# Patient Record
Sex: Female | Born: 1975 | Race: Black or African American | Hispanic: No | Marital: Married | Smoking: Never smoker
Health system: Southern US, Community
[De-identification: ages and names within clinical notes are randomized; demographics above are authoritative.]

## PROBLEM LIST (undated history)

## (undated) DIAGNOSIS — G8929 Other chronic pain: Secondary | ICD-10-CM

## (undated) DIAGNOSIS — F419 Anxiety disorder, unspecified: Secondary | ICD-10-CM

## (undated) DIAGNOSIS — F32A Depression, unspecified: Secondary | ICD-10-CM

## (undated) DIAGNOSIS — D649 Anemia, unspecified: Secondary | ICD-10-CM

## (undated) DIAGNOSIS — F329 Major depressive disorder, single episode, unspecified: Secondary | ICD-10-CM

## (undated) DIAGNOSIS — F319 Bipolar disorder, unspecified: Secondary | ICD-10-CM

## (undated) DIAGNOSIS — R109 Unspecified abdominal pain: Secondary | ICD-10-CM

## (undated) HISTORY — PX: ABDOMINAL HYSTERECTOMY: SHX81

## (undated) HISTORY — PX: KNEE SURGERY: SHX244

---

## 1998-02-20 HISTORY — PX: TUBAL LIGATION: SHX77

## 1999-11-15 ENCOUNTER — Emergency Department (HOSPITAL_COMMUNITY): Admission: EM | Admit: 1999-11-15 | Discharge: 1999-11-15 | Payer: Self-pay | Admitting: Emergency Medicine

## 2000-02-21 HISTORY — PX: MASS EXCISION: SHX2000

## 2000-06-05 ENCOUNTER — Encounter (INDEPENDENT_AMBULATORY_CARE_PROVIDER_SITE_OTHER): Payer: Self-pay

## 2000-06-05 ENCOUNTER — Ambulatory Visit (HOSPITAL_COMMUNITY): Admission: RE | Admit: 2000-06-05 | Discharge: 2000-06-05 | Payer: Self-pay | Admitting: *Deleted

## 2001-05-26 ENCOUNTER — Emergency Department (HOSPITAL_COMMUNITY): Admission: EM | Admit: 2001-05-26 | Discharge: 2001-05-27 | Payer: Self-pay | Admitting: *Deleted

## 2001-05-27 ENCOUNTER — Encounter: Payer: Self-pay | Admitting: *Deleted

## 2002-09-18 ENCOUNTER — Emergency Department (HOSPITAL_COMMUNITY): Admission: EM | Admit: 2002-09-18 | Discharge: 2002-09-18 | Payer: Self-pay | Admitting: Emergency Medicine

## 2003-03-30 ENCOUNTER — Ambulatory Visit (HOSPITAL_COMMUNITY): Admission: RE | Admit: 2003-03-30 | Discharge: 2003-03-30 | Payer: Self-pay | Admitting: Internal Medicine

## 2003-04-20 ENCOUNTER — Observation Stay (HOSPITAL_COMMUNITY): Admission: EM | Admit: 2003-04-20 | Discharge: 2003-04-21 | Payer: Self-pay | Admitting: Emergency Medicine

## 2003-04-23 ENCOUNTER — Other Ambulatory Visit (HOSPITAL_COMMUNITY): Admission: RE | Admit: 2003-04-23 | Discharge: 2003-05-01 | Payer: Self-pay | Admitting: Psychiatry

## 2003-08-20 ENCOUNTER — Other Ambulatory Visit (HOSPITAL_COMMUNITY): Admission: RE | Admit: 2003-08-20 | Discharge: 2003-09-02 | Payer: Self-pay | Admitting: Psychiatry

## 2003-10-17 ENCOUNTER — Emergency Department (HOSPITAL_COMMUNITY): Admission: EM | Admit: 2003-10-17 | Discharge: 2003-10-17 | Payer: Self-pay | Admitting: Internal Medicine

## 2004-10-30 ENCOUNTER — Emergency Department (HOSPITAL_COMMUNITY): Admission: EM | Admit: 2004-10-30 | Discharge: 2004-10-30 | Payer: Self-pay | Admitting: Emergency Medicine

## 2005-12-13 ENCOUNTER — Other Ambulatory Visit (HOSPITAL_COMMUNITY): Admission: RE | Admit: 2005-12-13 | Discharge: 2006-03-13 | Payer: Self-pay | Admitting: Psychiatry

## 2005-12-13 ENCOUNTER — Ambulatory Visit: Payer: Self-pay | Admitting: Psychiatry

## 2008-08-06 ENCOUNTER — Emergency Department (HOSPITAL_COMMUNITY): Admission: EM | Admit: 2008-08-06 | Discharge: 2008-08-06 | Payer: Self-pay | Admitting: Emergency Medicine

## 2010-02-20 HISTORY — PX: NOVASURE ABLATION: SHX5394

## 2010-07-08 NOTE — Op Note (Signed)
[facility]  Patient:    Laurie Thornton, Laurie Thornton                       MRN: [id number] Proc. Date: 06/05/00 Attending:  Vikki Ports.                           Operative Report  PREOPERATIVE DIAGNOSIS:  Right anterior abdominal wall mass.  POSTOPERATIVE DIAGNOSIS:  Right anterior abdominal wall mass.  PROCEDURE:  Excision of abdominal wall mass.  SURGEON:  Danna Hefty, M.D.  ANESTHESIA:  Local MAC.  DESCRIPTION OF PROCEDURE:  The patient was taken to the operating room, placed in supine position, after adequate anesthesia was induced using MAC technique, the right lower quadrant was prepped and draped in normal sterile fashion. In an area just anterior to the inferior superior iliac spine on the right, the skin and subcutaneous tissue was anesthetized. I dissected down onto a lobulated fatty nodular mass which was excised in its entirety. On further palpation, there were no other masses noted. This was sent for pathologic evaluation. Adequate hemostasis was ensured and the skin was closed with subcuticular 4-0 monocryl, Steri-Strips and sterile dressing was applied. The patient tolerated the procedure well and went to PACU in good condition. DD:  06/05/00 TD:  06/06/00 Job: [id number]

## 2013-06-26 ENCOUNTER — Emergency Department (HOSPITAL_COMMUNITY)
Admission: EM | Admit: 2013-06-26 | Discharge: 2013-06-26 | Disposition: A | Discharge status: Home / Self Care | Payer: Medicaid Other | Attending: Emergency Medicine | Admitting: Emergency Medicine

## 2013-06-26 ENCOUNTER — Encounter (HOSPITAL_COMMUNITY): Payer: Self-pay | Admitting: Emergency Medicine

## 2013-06-26 ENCOUNTER — Emergency Department (HOSPITAL_COMMUNITY): Payer: Medicaid Other

## 2013-06-26 DIAGNOSIS — R52 Pain, unspecified: Secondary | ICD-10-CM | POA: Insufficient documentation

## 2013-06-26 DIAGNOSIS — G8929 Other chronic pain: Secondary | ICD-10-CM | POA: Insufficient documentation

## 2013-06-26 DIAGNOSIS — R109 Unspecified abdominal pain: Secondary | ICD-10-CM

## 2013-06-26 DIAGNOSIS — Z880 Allergy status to penicillin: Secondary | ICD-10-CM | POA: Insufficient documentation

## 2013-06-26 DIAGNOSIS — B9689 Other specified bacterial agents as the cause of diseases classified elsewhere: Secondary | ICD-10-CM | POA: Insufficient documentation

## 2013-06-26 DIAGNOSIS — Z87440 Personal history of urinary (tract) infections: Secondary | ICD-10-CM | POA: Insufficient documentation

## 2013-06-26 DIAGNOSIS — N760 Acute vaginitis: Secondary | ICD-10-CM | POA: Insufficient documentation

## 2013-06-26 DIAGNOSIS — Z3202 Encounter for pregnancy test, result negative: Secondary | ICD-10-CM | POA: Insufficient documentation

## 2013-06-26 DIAGNOSIS — A499 Bacterial infection, unspecified: Secondary | ICD-10-CM | POA: Insufficient documentation

## 2013-06-26 HISTORY — DX: Unspecified abdominal pain: R10.9

## 2013-06-26 HISTORY — DX: Other chronic pain: G89.29

## 2013-06-26 LAB — CBC WITH DIFFERENTIAL/PLATELET
Basophils Absolute: 0 10*3/uL (ref 0.0–0.1)
Basophils Relative: 0 % (ref 0–1)
EOS ABS: 0.1 10*3/uL (ref 0.0–0.7)
Eosinophils Relative: 2 % (ref 0–5)
HCT: 37.4 % (ref 36.0–46.0)
Hemoglobin: 12.4 g/dL (ref 12.0–15.0)
LYMPHS ABS: 2.2 10*3/uL (ref 0.7–4.0)
Lymphocytes Relative: 47 % — ABNORMAL HIGH (ref 12–46)
MCH: 31.1 pg (ref 26.0–34.0)
MCHC: 33.2 g/dL (ref 30.0–36.0)
MCV: 93.7 fL (ref 78.0–100.0)
Monocytes Absolute: 0.4 10*3/uL (ref 0.1–1.0)
Monocytes Relative: 9 % (ref 3–12)
NEUTROS PCT: 42 % — AB (ref 43–77)
Neutro Abs: 2 10*3/uL (ref 1.7–7.7)
PLATELETS: 254 10*3/uL (ref 150–400)
RBC: 3.99 MIL/uL (ref 3.87–5.11)
RDW: 12.4 % (ref 11.5–15.5)
WBC: 4.8 10*3/uL (ref 4.0–10.5)

## 2013-06-26 LAB — COMPREHENSIVE METABOLIC PANEL
ALK PHOS: 45 U/L (ref 39–117)
ALT: 10 U/L (ref 0–35)
AST: 21 U/L (ref 0–37)
Albumin: 3.4 g/dL — ABNORMAL LOW (ref 3.5–5.2)
BUN: 12 mg/dL (ref 6–23)
CO2: 25 meq/L (ref 19–32)
Calcium: 9.6 mg/dL (ref 8.4–10.5)
Chloride: 104 mEq/L (ref 96–112)
Creatinine, Ser: 0.71 mg/dL (ref 0.50–1.10)
GFR calc non Af Amer: >90 mL/min (ref >90)
Glucose, Bld: 111 mg/dL — ABNORMAL HIGH (ref 70–99)
Potassium: 4.5 mEq/L (ref 3.7–5.3)
SODIUM: 139 meq/L (ref 137–147)
TOTAL PROTEIN: 6.9 g/dL (ref 6.0–8.3)
Total Bilirubin: 0.3 mg/dL (ref 0.3–1.2)

## 2013-06-26 LAB — URINALYSIS, ROUTINE W REFLEX MICROSCOPIC
Bilirubin Urine: NEGATIVE
Glucose, UA: NEGATIVE mg/dL
Ketones, ur: NEGATIVE mg/dL
Nitrite: NEGATIVE
Protein, ur: NEGATIVE mg/dL
Specific Gravity, Urine: 1.021 (ref 1.005–1.030)
Urobilinogen, UA: 0.2 mg/dL (ref 0.0–1.0)
pH: 7 (ref 5.0–8.0)

## 2013-06-26 LAB — WET PREP, GENITAL
TRICH WET PREP: NONE SEEN
YEAST WET PREP: NONE SEEN

## 2013-06-26 LAB — URINE MICROSCOPIC-ADD ON

## 2013-06-26 LAB — PREGNANCY, URINE: Preg Test, Ur: NEGATIVE

## 2013-06-26 LAB — LIPASE, BLOOD: Lipase: 26 U/L (ref 11–59)

## 2013-06-26 MED ORDER — MORPHINE SULFATE 4 MG/ML IJ SOLN
4.0000 mg | INTRAMUSCULAR | Status: AC | PRN
  Administered 2013-06-26 (×2): 4 mg via INTRAVENOUS
  Filled 2013-06-26 (×2): qty 1

## 2013-06-26 MED ORDER — NAPROXEN 250 MG PO TABS: 250.0000 mg | ORAL_TABLET | Freq: Two times a day (BID) | ORAL | Status: DC

## 2013-06-26 MED ORDER — METRONIDAZOLE 500 MG PO TABS: 500.0000 mg | ORAL_TABLET | Freq: Two times a day (BID) | ORAL | Status: DC

## 2013-06-26 MED ORDER — ONDANSETRON HCL 4 MG/2ML IJ SOLN
4.0000 mg | INTRAMUSCULAR | Status: AC | PRN
  Administered 2013-06-26 (×2): 4 mg via INTRAVENOUS
  Filled 2013-06-26 (×2): qty 2

## 2013-06-26 MED ORDER — IOHEXOL 300 MG/ML  SOLN
80.0000 mL | Freq: Once | INTRAMUSCULAR | Status: AC | PRN
  Administered 2013-06-26: 80 mL via INTRAVENOUS

## 2013-06-26 MED ORDER — SODIUM CHLORIDE 0.9 % IV SOLN
INTRAVENOUS | Status: DC
  Administered 2013-06-26: 10:00:00 via INTRAVENOUS

## 2013-06-26 MED ORDER — HYDROCODONE-ACETAMINOPHEN 5-325 MG PO TABS: ORAL_TABLET | ORAL | Status: DC

## 2013-06-26 MED ORDER — IOHEXOL 300 MG/ML  SOLN
25.0000 mL | Freq: Once | INTRAMUSCULAR | Status: AC | PRN
  Administered 2013-06-26: 25 mL via ORAL

## 2013-06-26 NOTE — ED Provider Notes (Signed)
CSN: [id number]     Arrival date & time 06/26/13  0844 History   First MD Initiated Contact with Patient 06/26/13 [phone]     Chief Complaint  Patient presents with  . Abdominal Pain     HPI Pt was seen at 0900.  Per pt, c/o gradual onset and persistence of constant acute flair of her chronic right sided abd "pain" for the past 1+ year. Pt states the pain was intermittent, but became constant for the past 6 months. Pt states the pain "worsened" 2 days ago. Pt describes the pain as "sharp," and radiates into her right lower back area. Has been associated with urinary frequency.  Pt has been evaluated by her PMD multiple times for same, dx with hematuria and UTI, tx with several courses of abx.  Denies N/V/D, no vaginal bleeding/discharge, no fevers, no back pain, no rash, no CP/SOB, no black or blood in stools.      PMD: [facility] FP Past Medical History  Diagnosis Date  . Chronic abdominal pain     Past Surgical History  Procedure Laterality Date  . Novasure ablation    . Tubal ligation    . Cesarean section    . Knee surgery Right x 2  . Mass excision  2002    right sided abd wall mass excision   No family history on file. History  Substance Use Topics  . Smoking status: Never Smoker   . Smokeless tobacco: Not on file  . Alcohol Use: No    Review of Systems ROS: Statement: All systems negative except as marked or noted in the HPI; Constitutional: Negative for fever and chills. ; ; Eyes: Negative for eye pain, redness and discharge. ; ; ENMT: Negative for ear pain, hoarseness, nasal congestion, sinus pressure and sore throat. ; ; Cardiovascular: Negative for chest pain, palpitations, diaphoresis, dyspnea and peripheral edema. ; ; Respiratory: Negative for cough, wheezing and stridor. ; ; Gastrointestinal: +abd pain. Negative for nausea, vomiting, diarrhea, blood in stool, hematemesis, jaundice and rectal bleeding. . ; ; Genitourinary: +urinary frequency. Negative for dysuria. ; ;  Musculoskeletal: +right sided low back pain. Negative for neck pain. Negative for swelling and trauma.; ; GYN:  No vaginal bleeding, no vaginal discharge, no vulvar pain.;;  Skin: Negative for pruritus, rash, abrasions, blisters, bruising and skin lesion.; ; Neuro: Negative for headache, lightheadedness and neck stiffness. Negative for weakness, altered level of consciousness , altered mental status, extremity weakness, paresthesias, involuntary movement, seizure and syncope.      Allergies  Amoxicillin  Home Medications   Prior to Admission medications   Not on File   BP 130/82  Pulse 65  Temp(Src) 97.4 F (36.3 C) (Oral)  Resp 18  Ht 5\' 6"  (1.676 m)  Wt 180 lb (81.647 kg)  BMI 29.07 kg/m2  SpO2 100% Physical Exam 0905: Physical examination:  Nursing notes reviewed; Vital signs and O2 SAT reviewed;  Constitutional: Well developed, Well nourished, Well hydrated, In no acute distress; Head:  Normocephalic, atraumatic; Eyes: EOMI, PERRL, No scleral icterus; ENMT: Mouth and pharynx normal, Mucous membranes moist; Neck: Supple, Full range of motion, No lymphadenopathy; Cardiovascular: Regular rate and rhythm, No murmur, rub, or gallop; Respiratory: Breath sounds clear & equal bilaterally, No rales, rhonchi, wheezes.  Speaking full sentences with ease, Normal respiratory effort/excursion; Chest: Nontender, Movement normal; Abdomen: Soft, +RUQ, RLQ tenderness to palp. Nondistended, Normal bowel sounds; Genitourinary: No CVA tenderness. Pelvic exam performed with permission of pt and female ED tech assist  during exam.  External genitalia w/o lesions. Vaginal vault without discharge.  Cervix w/o lesions, not friable, GC/chlam and wet prep obtained and sent to lab.  Bimanual exam w/o CMT, +uterine and bilateral adnexal tenderness.;; Spine:  No midline CS, TS, LS tenderness. +TTP right lumbar paraspinal muscles. No rash.;; Extremities: Pulses normal, No tenderness, No edema, No calf edema or asymmetry.;  Neuro: AA&Ox3, Major CN grossly intact.  Speech clear. No gross focal motor or sensory deficits in extremities. Climbs on and off stretcher easily by herself. Gait steady.; Skin: Color normal, Warm, Dry.   ED Course  Procedures     EKG Interpretation None      MDM  MDM Reviewed: previous chart, nursing note and vitals Reviewed previous: labs and ultrasound Interpretation: labs, x-ray, CT scan and ultrasound    Results for orders placed during the hospital encounter of 06/26/13  WET PREP, GENITAL      Result Value Ref Range   Yeast Wet Prep HPF POC NONE SEEN  NONE SEEN   Trich, Wet Prep NONE SEEN  NONE SEEN   Clue Cells Wet Prep HPF POC FEW (*) NONE SEEN   WBC, Wet Prep HPF POC FEW (*) NONE SEEN  URINALYSIS, ROUTINE W REFLEX MICROSCOPIC      Result Value Ref Range   Color, Urine YELLOW  YELLOW   APPearance CLEAR  CLEAR   Specific Gravity, Urine 1.021  1.005 - 1.030   pH 7.0  5.0 - 8.0   Glucose, UA NEGATIVE  NEGATIVE mg/dL   Hgb urine dipstick MODERATE (*) NEGATIVE   Bilirubin Urine NEGATIVE  NEGATIVE   Ketones, ur NEGATIVE  NEGATIVE mg/dL   Protein, ur NEGATIVE  NEGATIVE mg/dL   Urobilinogen, UA 0.2  0.0 - 1.0 mg/dL   Nitrite NEGATIVE  NEGATIVE   Leukocytes, UA TRACE (*) NEGATIVE  PREGNANCY, URINE      Result Value Ref Range   Preg Test, Ur NEGATIVE  NEGATIVE  CBC WITH DIFFERENTIAL      Result Value Ref Range   WBC 4.8  4.0 - 10.5 K/uL   RBC 3.99  3.87 - 5.11 MIL/uL   Hemoglobin 12.4  12.0 - 15.0 g/dL   HCT 37.4  36.0 - 46.0 %   MCV 93.7  78.0 - 100.0 fL   MCH 31.1  26.0 - 34.0 pg   MCHC 33.2  30.0 - 36.0 g/dL   RDW 12.4  11.5 - 15.5 %   Platelets 254  150 - 400 K/uL   Neutrophils Relative % 42 (*) 43 - 77 %   Neutro Abs 2.0  1.7 - 7.7 K/uL   Lymphocytes Relative 47 (*) 12 - 46 %   Lymphs Abs 2.2  0.7 - 4.0 K/uL   Monocytes Relative 9  3 - 12 %   Monocytes Absolute 0.4  0.1 - 1.0 K/uL   Eosinophils Relative 2  0 - 5 %   Eosinophils Absolute 0.1  0.0 - 0.7  K/uL   Basophils Relative 0  0 - 1 %   Basophils Absolute 0.0  0.0 - 0.1 K/uL  COMPREHENSIVE METABOLIC PANEL      Result Value Ref Range   Sodium 139  137 - 147 mEq/L   Potassium 4.5  3.7 - 5.3 mEq/L   Chloride 104  96 - 112 mEq/L   CO2 25  19 - 32 mEq/L   Glucose, Bld 111 (*) 70 - 99 mg/dL   BUN 12  6 - 23 mg/dL  Creatinine, Ser 0.71  0.50 - 1.10 mg/dL   Calcium 9.6  8.4 - 10.5 mg/dL   Total Protein 6.9  6.0 - 8.3 g/dL   Albumin 3.4 (*) 3.5 - 5.2 g/dL   AST 21  0 - 37 U/L   ALT 10  0 - 35 U/L   Alkaline Phosphatase 45  39 - 117 U/L   Total Bilirubin 0.3  0.3 - 1.2 mg/dL   GFR calc non Af Amer >90  >90 mL/min   GFR calc Af Amer >90  >90 mL/min  LIPASE, BLOOD      Result Value Ref Range   Lipase 26  11 - 59 U/L  URINE MICROSCOPIC-ADD ON      Result Value Ref Range   Squamous Epithelial / LPF FEW (*) RARE   WBC, UA 3-6  <3 WBC/hpf   RBC / HPF 7-10  <3 RBC/hpf   Bacteria, UA FEW (*) RARE   Urine-Other MUCOUS PRESENT     Dg Chest 2 View 06/26/2013   CLINICAL DATA:  Right upper quadrant pain radiating to the back.  EXAM: CHEST  2 VIEW  COMPARISON:  None.  FINDINGS: Heart size and mediastinal contours are within normal limits. Both lungs are clear. Visualized skeletal structures are unremarkable.  IMPRESSION: Negative exam.   Electronically Signed   By: Inge Rise M.D.   On: 06/26/2013 10:29   US Transvaginal Non-ob 06/26/2013   CLINICAL DATA:  Right-sided pelvic pain.  Question ovarian torsion.  EXAM: TRANSABDOMINAL AND TRANSVAGINAL ULTRASOUND OF PELVIS  DOPPLER ULTRASOUND OF OVARIES  TECHNIQUE: Both transabdominal and transvaginal ultrasound examinations of the pelvis were performed. Transabdominal technique was performed for global imaging of the pelvis including uterus, ovaries, adnexal regions, and pelvic cul-de-sac.  It was necessary to proceed with endovaginal exam following the transabdominal exam to visualize the ovaries. Color and duplex Doppler ultrasound was utilized to  evaluate blood flow to the ovaries.  COMPARISON:  CT abdomen and pelvis earlier this same day. Pelvic ultrasound 03/30/2003.  FINDINGS: Uterus  Measurements: 9.7 x 5.3 x 5.5 cm. No fibroids or other mass visualized.  Endometrium  Thickness: 0.3 cm.  No focal abnormality visualized.  Right ovary  Measurements: 2.4 x 1.2 x 2.4 cm. Normal appearance/no adnexal mass.  Left ovary  Measurements: 3.3 x 1.2 x 1.1 cm. Normal appearance/no adnexal mass.  Pulsed Doppler evaluation of both ovaries demonstrates normal low-resistance arterial and venous waveforms.  Other findings  Small amount of free pelvic fluid is noted.  IMPRESSION: Negative for ovarian torsion.  Normal exam.   Electronically Signed   By: Inge Rise M.D.   On: 06/26/2013 14:50   Ct Abdomen Pelvis W Contrast 06/26/2013   CLINICAL DATA:  Right side abdominal pain radiating to the back with cramping. Symptoms for several months.  EXAM: CT ABDOMEN AND PELVIS WITH CONTRAST  TECHNIQUE: Multidetector CT imaging of the abdomen and pelvis was performed using the standard protocol following bolus administration of intravenous contrast.  CONTRAST:  80 mL OMNIPAQUE IOHEXOL 300 MG/ML  SOLN  COMPARISON:  None.  FINDINGS: Mild atelectasis is present in the lung bases. There is no pleural or pericardial effusion.  The liver, gallbladder, adrenal glands, spleen, pancreas, kidneys at and biliary tree appear normal. Uterus, adnexa and urinary bladder are unremarkable. There is a small volume of free pelvic fluid. The stomach, small bowel and appendix appear normal. There is fairly prominent stool in the ascending and transverse colon. The cecum is located the  midline. There is no lymphadenopathy. No focal bony abnormality.  IMPRESSION: Negative for appendicitis. No finding to explain the patient's symptoms.   Electronically Signed   By: Inge Rise M.D.   On: 06/26/2013 13:22     1510:  Workup reassuring. Will tx for BV. Pt wants to go home now. Dx and testing  d/w pt and family.  Questions answered.  Verb understanding, agreeable to d/c home with outpt f/u.      Alfonzo Feller, DO 06/28/13 1451

## 2013-06-26 NOTE — ED Notes (Signed)
Pt returned from CT °

## 2013-06-26 NOTE — ED Notes (Signed)
Pt is OTR

## 2013-06-26 NOTE — ED Notes (Signed)
Pt presents for recurring abd pain and UTI. Went to MD in January had a UTI, given antibiotic. Went back in February, had another UTI and given antibiotic. Returned in March with hematuria and given antibiotic. Has right sided flank and lower abd pain today. Last pap smear was done in January. Has had novasure procedure.

## 2013-06-26 NOTE — Discharge Instructions (Signed)
°Emergency Department Resource Guide °1) Find a Doctor and Pay Out of Pocket °Although you won't have to find out who is covered by your insurance plan, it is a good idea to ask around and get recommendations. You will then need to call the office and see if the doctor you have chosen will accept you as a new patient and what types of options they offer for patients who are self-pay. Some doctors offer discounts or will set up payment plans for their patients who do not have insurance, but you will need to ask so you aren't surprised when you get to your appointment. ° °2) Contact Your Local Health Department °Not all health departments have doctors that can see patients for sick visits, but many do, so it is worth a call to see if yours does. If you don't know where your local health department is, you can check in your phone book. The CDC also has a tool to help you locate your state's health department, and many state websites also have listings of all of their local health departments. ° °3) Find a Walk-in Clinic °If your illness is not likely to be very severe or complicated, you may want to try a walk in clinic. These are popping up all over the country in pharmacies, drugstores, and shopping centers. They're usually staffed by nurse practitioners or physician assistants that have been trained to treat common illnesses and complaints. They're usually fairly quick and inexpensive. However, if you have serious medical issues or chronic medical problems, these are probably not your best option. ° °No Primary Care Doctor: °- Call [facility] at  832-8000 - they can help you locate a primary care doctor that  accepts your insurance, provides certain services, etc. °- Physician Referral Service- 1-800-533-3463 ° °Chronic Pain Problems: °Organization         Address  Phone   Notes  °[facility]  ([phone]) [phone] Patients need to be referred by their primary care doctor.  ° °Medication  Assistance: °Organization         Address  Phone   Notes  °[place] [facility] [address] °[place], [place] [address] (336) 641-8030 --Must be a resident of [place] °-- Must have NO insurance coverage whatsoever (no Medicaid/ Medicare, etc.) °-- The pt. MUST have a primary care doctor that directs their care regularly and follows them in the community °  °[facility]  (866) 331-1348   °[employer]  (888) 892-1162   ° °Agencies that provide inexpensive medical care: °Organization         Address  Phone   Notes  °[facility]  (336) 832-8035   ° Internal Medicine    (336) 832-7272   °[facility] [address] °[place], [place] [address] (336) 832-4777   °[facility] [address], °[place] (336) 271-4999   °[facility]    (336) 373-0678   °[facility]    (336) 272-1050   °[facility] ° [address], [place] Phone:  (336) 832-4444, Fax:  (336) 832-4440 Hours of Operation:  9 am - 6 pm, M-F.  Also accepts Medicaid/Medicare and self-pay.  °[facility] ° [address], [place] Phone: (336) [phone], Fax: (336) [phone]. Hours of Operation:  8:30 am - 5:30 pm, M-F.  Also accepts Medicaid and self-pay.  °[facility] [place] [address], [place] Phone: (336) 878-6027   °[facility] [address], [place], [place] (336)723-1848, Ext. 123 Mondays & Thursdays: 7-9 AM.  First 15 patients are seen on a first come, first serve basis. °  ° °Medicaid-accepting [place] Providers: ° °Organization         Address  Phone   Notes  °[facility] [address], [place] (336) 641-2100 Also accepts self-pay patients.  °[facility] [address], [place] ° (336) [phone]   °[facility] [address], [place]  (336) 288-8857   °[facility] [address] [place] [address], [place] (336) 299-7000   °Veita Bland [address], [place]  ° (336) [phone] Only accepts [place] Access Medicaid patients after they have their name applied to their card.  ° °Self-Pay (no insurance) in [place]: ° °Organization         Address  Phone   Notes  °Sickle Cell Patients, [facility] [address], [place] (336) 832-1970   °[facility] [address], [place] (336) 832-4400   °[facility] [place] ° [address], [place] (336) 992-4800   °[facility]/Dr. Osei-Bonsu ° [address] [place] [address], [place] or [address], [place] (336) [phone] Phone number for both [place] and [place] locations is the same.  °[facility] [address], [place] (336) 299-0000   °[facility] [address] [place] [address], [place] or [address] (336) 852-7530 °(336) 878-2260   °[facility] [address], [place] (336) 350-1642, phone; (336) 294-5005, fax Sees patients 1st and 3rd Saturday of every month.  Must not qualify for public or private insurance (i.e. Medicaid, Medicare, [place] Health Choice, Veterans' Benefits) • Household income should be no more than 200% of the poverty level •The clinic cannot treat you if you are pregnant or think you are pregnant • Sexually transmitted diseases are not treated at the clinic.  ° ° °Dental Care: °Organization         Address  Phone  Notes  °[place] [facility] [address], [place] (336) 641-6152 Accepts children up to age 21 who are enrolled in Medicaid or [place] Health Choice; pregnant women with a Medicaid card; and children who have applied for Medicaid or [place] Health Choice, but were declined, whose parents can pay a reduced fee at time of service.  °[place]  [facility] [place]  [address], [place] (336) 641-7733 Accepts children up to age 21 who are enrolled in Medicaid or [place] Health Choice; pregnant women with a Medicaid card; and children who have applied for Medicaid or [place] Health Choice, but were declined, whose parents can pay a reduced fee at time of service.  °[facility] ° [address], [place] (336) 641-4533 Patients are seen by appointment only. Walk-ins are not accepted. [facility] will see patients 18 years of age and older. °Monday - Tuesday (8am-5pm) °Most Wednesdays (8:30-5pm) °$30 per visit, cash only  °[facility] ° [address], [place] (336) 641-4533 Patients are seen by appointment only. Walk-ins are not accepted. [facility] will see patients 18 years of age and older. °One   Wednesday Evening (Monthly: Volunteer Based).  $30 per visit, cash only  °[facility]  (919) [phone] for adults; Children under age 4, call Graduate Pediatric Dentistry at ([phone]) [phone]. Children aged 4-14, please call ([phone]) [phone] to request a pediatric application. ° Dental services are provided in all areas of dental care including fillings, crowns and bridges, complete and partial dentures, implants, gum treatment, root canals, and extractions. Preventive care is also provided. Treatment is provided to both adults and children. °Patients are selected via a lottery and there is often a waiting list. °  °[facility] [address], °[place] ° (336) 763-8833 [email or web address] °  °[facility] [address], [place], [place] (336)723-1848, Ext. 123 Second and Fourth Thursday of each month, opens at 6:30 AM; Clinic ends at 9 AM.  Patients are seen on a first-come first-served basis, and a limited number are seen during each clinic.  ° °[facility] ° [address], [place], [place] (336) [phone]    Eligibility Requirements °You must have lived in [place], or [place] counties for at least the last three months. °  You cannot be eligible for state or federal sponsored healthcare insurance, including Veterans Administration, Medicaid, or Medicare. °  You generally cannot be eligible for healthcare insurance through your employer.  °  How to apply: °Eligibility screenings are held every Tuesday and Wednesday afternoon from 1:00 pm until 4:00 pm. You do not need an appointment for the interview!  °[facility] [address], [place], [place] 336-631-2330   °[facility]  336-342-8273   °[facility]  336-703-3100   °[facility]  336-570-6415   ° °Behavioral Health Resources in the Community: °Intensive Outpatient Programs °Organization         Address  Phone  Notes  °[facility] [address], [place], [place] 336-878-6098   °[facility] [address], [place], [place] 336-832-9800   °[facility] [address], [place], [place] ° 336-882-2125   °[facility] [address],  °[place], [place] 1-800-853-5163 or 336-641-4981   °Substance Abuse Resources °Organization         Address  Phone  Notes  °[facility]  336-882-2125   °[facility]  336-784-9470   °[facility]  336-285-9073   °[facility]  336-845-3988   °Residential & Outpatient Substance Abuse Program  1-800-659-3381   °Psychological Services °Organization         Address  Phone  Notes  °[facility]  336- 832-9600   °[facility]  336- 378-7881   °[facility] [address], [place] 1-800-853-5163 or 336-641-4981   ° °Mobile Crisis Teams °Organization         Address  Phone  Notes  °[facility], Mobile Crisis Care Unit  1-877-626-1772   °Assertive °[facility] ° [address]  [place], [place] 336-834-9664   °Sharon DeEsch [address] °[place] 336-554-5454   ° °Self-Help/Support Groups °Organization         Address  Phone             Notes  °[facility] [place] - variety of support groups  336- 373-1402 Call for more information  °Narcotics Anonymous (NA), [facility] [address], °[place]   2 meetings at this location  ° °  Residential Treatment Programs Organization         Address  Phone  Notes  [facility] [address],    [place]  1-445-589-8393   [facility]  [address] [place] [address], [place], [place]   [place], [place] [phone] Admissions: 8am-3pm M-F  [facility] [place] [address],    [place], [place] [phone]   [facility] [address] [place], [place], [place]   [facility] [address]  [place], [place]   [facility] - Intensive Outpatient [address], [place], [place]   [facility] [place]  [place], [place] 1-(639) 265-7607 or (854) 740-8253   [facility] [address], [place], [place] Accepts Medicaid  [facility] [address]  [place] 1-762-225-2498 Substance Abuse/Addiction Treatment   [facility] Organization         Address  Phone  Notes  [facility]  [phone]   Domenic Schwab, PhD [address] Arlis [place], [place]   (706)564-4189 or [phone]   [place], [place] (906)[phone]   [facility] [address] [place], [place] 857-843-7830 Insurance/Medicaid/sponsorship through [facility] [address]                                    [place], [place] [phone] [place] [address] [place] [address], [place] [phone]    Dr. Adele Schilder  [phone]   [facility] Dept. 1) [address] [place] 2) [place] 3)  [place], [place] [phone]  [phone]   [place] [phone] or [phone] (After Hours)       Take the prescriptions as directed.  Apply moist heat or ice to the area(s) of discomfort, for 15 minutes at a time, several times per day for the next few days.  Do not fall asleep on a heating or ice pack.  Call your regular medical doctor today schedule a follow up appointment within the next 4 days.  Return to the Emergency Department immediately if worsening.

## 2013-06-27 LAB — GC/CHLAMYDIA PROBE AMP
CT PROBE, AMP APTIMA: NEGATIVE
GC Probe RNA: NEGATIVE

## 2013-11-01 ENCOUNTER — Other Ambulatory Visit (HOSPITAL_COMMUNITY)
Admission: RE | Admit: 2013-11-01 | Discharge: 2013-11-01 | Disposition: A | Discharge status: Home / Self Care | Payer: Medicaid Other | Source: Ambulatory Visit | Attending: Family Medicine | Admitting: Family Medicine

## 2013-11-01 ENCOUNTER — Emergency Department (HOSPITAL_COMMUNITY)
Admission: EM | Admit: 2013-11-01 | Discharge: 2013-11-01 | Disposition: A | Discharge status: Home / Self Care | Payer: Medicaid Other | Source: Home / Self Care | Attending: Family Medicine | Admitting: Family Medicine

## 2013-11-01 ENCOUNTER — Encounter (HOSPITAL_COMMUNITY): Payer: Self-pay | Admitting: Emergency Medicine

## 2013-11-01 DIAGNOSIS — N760 Acute vaginitis: Secondary | ICD-10-CM | POA: Diagnosis present

## 2013-11-01 DIAGNOSIS — Z113 Encounter for screening for infections with a predominantly sexual mode of transmission: Secondary | ICD-10-CM | POA: Insufficient documentation

## 2013-11-01 DIAGNOSIS — M545 Low back pain, unspecified: Secondary | ICD-10-CM

## 2013-11-01 DIAGNOSIS — G8929 Other chronic pain: Secondary | ICD-10-CM

## 2013-11-01 DIAGNOSIS — R109 Unspecified abdominal pain: Secondary | ICD-10-CM

## 2013-11-01 LAB — POCT URINALYSIS DIP (DEVICE)
BILIRUBIN URINE: NEGATIVE
Glucose, UA: NEGATIVE mg/dL
Ketones, ur: NEGATIVE mg/dL
Leukocytes, UA: NEGATIVE
NITRITE: NEGATIVE
PH: 7.5 (ref 5.0–8.0)
Protein, ur: NEGATIVE mg/dL
Specific Gravity, Urine: 1.015 (ref 1.005–1.030)
Urobilinogen, UA: 0.2 mg/dL (ref 0.0–1.0)

## 2013-11-01 LAB — CBC WITH DIFFERENTIAL/PLATELET
BASOS PCT: 0 % (ref 0–1)
Basophils Absolute: 0 10*3/uL (ref 0.0–0.1)
EOS ABS: 0.1 10*3/uL (ref 0.0–0.7)
Eosinophils Relative: 1 % (ref 0–5)
HEMATOCRIT: 38.2 % (ref 36.0–46.0)
HEMOGLOBIN: 12.7 g/dL (ref 12.0–15.0)
Lymphocytes Relative: 42 % (ref 12–46)
Lymphs Abs: 2.1 10*3/uL (ref 0.7–4.0)
MCH: 31 pg (ref 26.0–34.0)
MCHC: 33.2 g/dL (ref 30.0–36.0)
MCV: 93.2 fL (ref 78.0–100.0)
MONOS PCT: 9 % (ref 3–12)
Monocytes Absolute: 0.4 10*3/uL (ref 0.1–1.0)
Neutro Abs: 2.4 10*3/uL (ref 1.7–7.7)
Neutrophils Relative %: 48 % (ref 43–77)
Platelets: 283 10*3/uL (ref 150–400)
RBC: 4.1 MIL/uL (ref 3.87–5.11)
RDW: 12.4 % (ref 11.5–15.5)
WBC: 5 10*3/uL (ref 4.0–10.5)

## 2013-11-01 LAB — LIPASE, BLOOD: LIPASE: 31 U/L (ref 11–59)

## 2013-11-01 LAB — COMPREHENSIVE METABOLIC PANEL
ALT: 7 U/L (ref 0–35)
ANION GAP: 10 (ref 5–15)
AST: 13 U/L (ref 0–37)
Albumin: 3.7 g/dL (ref 3.5–5.2)
Alkaline Phosphatase: 46 U/L (ref 39–117)
BUN: 14 mg/dL (ref 6–23)
CALCIUM: 9.7 mg/dL (ref 8.4–10.5)
CO2: 26 mEq/L (ref 19–32)
Chloride: 103 mEq/L (ref 96–112)
Creatinine, Ser: 0.83 mg/dL (ref 0.50–1.10)
GFR calc Af Amer: >90 mL/min (ref >90)
GFR calc non Af Amer: 89 mL/min — ABNORMAL LOW (ref >90)
Glucose, Bld: 92 mg/dL (ref 70–99)
Potassium: 4 mEq/L (ref 3.7–5.3)
Sodium: 139 mEq/L (ref 137–147)
TOTAL PROTEIN: 7.5 g/dL (ref 6.0–8.3)
Total Bilirubin: 0.4 mg/dL (ref 0.3–1.2)

## 2013-11-01 LAB — POCT PREGNANCY, URINE: Preg Test, Ur: NEGATIVE

## 2013-11-01 MED ORDER — FLUCONAZOLE 150 MG PO TABS: 150.0000 mg | ORAL_TABLET | Freq: Once | ORAL | Status: DC

## 2013-11-01 MED ORDER — METRONIDAZOLE 500 MG PO TABS: 500.0000 mg | ORAL_TABLET | Freq: Two times a day (BID) | ORAL | Status: DC

## 2013-11-01 MED ORDER — IBUPROFEN 800 MG PO TABS: 800.0000 mg | ORAL_TABLET | Freq: Three times a day (TID) | ORAL | Status: DC

## 2013-11-01 NOTE — ED Provider Notes (Signed)
Medical screening examination/treatment/procedure(s) were performed by a resident physician or non-physician practitioner and as the supervising physician I was immediately available for consultation/collaboration.  Linna Darner, MD Family Medicine   Waldemar Dickens, MD 11/01/13 [phone]

## 2013-11-01 NOTE — ED Notes (Signed)
C/o abdominal pain onset 1 week ago in RLQ.  No fever, N, V or D.  C/o constipation.  Last BM 2 days ago.

## 2013-11-01 NOTE — Discharge Instructions (Signed)
Chronic Pain Chronic pain can be defined as pain that is off and on and lasts for 3-6 months or longer. Many things cause chronic pain, which can make it difficult to make a diagnosis. There are many treatment options available for chronic pain. However, finding a treatment that works well for you may require trying various approaches until the right one is found. Many people benefit from a combination of two or more types of treatment to control their pain. SYMPTOMS  Chronic pain can occur anywhere in the body and can range from mild to very severe. Some types of chronic pain include:  Headache.  Low back pain.  Cancer pain.  Arthritis pain.  Neurogenic pain. This is pain resulting from damage to nerves. People with chronic pain may also have other symptoms such as:  Depression.  Anger.  Insomnia.  Anxiety. DIAGNOSIS  Your health care provider will help diagnose your condition over time. In many cases, the initial focus will be on excluding possible conditions that could be causing the pain. Depending on your symptoms, your health care provider may order tests to diagnose your condition. Some of these tests may include:   Blood tests.   CT scan.   MRI.   X-rays.   Ultrasounds.   Nerve conduction studies.  You may need to see a specialist.  TREATMENT  Finding treatment that works well may take time. You may be referred to a pain specialist. He or she may prescribe medicine or therapies, such as:   Mindful meditation or yoga.  Shots (injections) of numbing or pain-relieving medicines into the spine or area of pain.  Local electrical stimulation.  Acupuncture.   Massage therapy.   Aroma, color, light, or sound therapy.   Biofeedback.   Working with a physical therapist to keep from getting stiff.   Regular, gentle exercise.   Cognitive or behavioral therapy.   Group support.  Sometimes, surgery may be recommended.  HOME CARE INSTRUCTIONS    Take all medicines as directed by your health care provider.   Lessen stress in your life by relaxing and doing things such as listening to calming music.   Exercise or be active as directed by your health care provider.   Eat a healthy diet and include things such as vegetables, fruits, fish, and lean meats in your diet.   Keep all follow-up appointments with your health care provider.   Attend a support group with others suffering from chronic pain. SEEK MEDICAL CARE IF:   Your pain gets worse.   You develop a new pain that was not there before.   You cannot tolerate medicines given to you by your health care provider.   You have new symptoms since your last visit with your health care provider.  SEEK IMMEDIATE MEDICAL CARE IF:   You feel weak.   You have decreased sensation or numbness.   You lose control of bowel or bladder function.   Your pain suddenly gets much worse.   You develop shaking.  You develop chills.  You develop confusion.  You develop chest pain.  You develop shortness of breath.  MAKE SURE YOU:  Understand these instructions.  Will watch your condition.  Will get help right away if you are not doing well or get worse. Document Released: 10/29/2001 Document Revised: 10/09/2012 Document Reviewed: 08/02/2012 [facility] Patient Information 2015 [place], [place]. This information is not intended to replace advice given to you by your health care provider. Make sure you discuss any  questions you have with your health care provider.  Vaginitis Vaginitis is an inflammation of the vagina. It is most often caused by a change in the normal balance of the bacteria and yeast that live in the vagina. This change in balance causes an overgrowth of certain bacteria or yeast, which causes the inflammation. There are different types of vaginitis, but the most common types are:  Bacterial vaginosis.  Yeast infection  (candidiasis).  Trichomoniasis vaginitis. This is a sexually transmitted infection (STI).  Viral vaginitis.  Atropic vaginitis.  Allergic vaginitis. CAUSES  The cause depends on the type of vaginitis. Vaginitis can be caused by:  Bacteria (bacterial vaginosis).  Yeast (yeast infection).  A parasite (trichomoniasis vaginitis)  A virus (viral vaginitis).  Low hormone levels (atrophic vaginitis). Low hormone levels can occur during pregnancy, breastfeeding, or after menopause.  Irritants, such as bubble baths, scented tampons, and feminine sprays (allergic vaginitis). Other factors can change the normal balance of the yeast and bacteria that live in the vagina. These include:  Antibiotic medicines.  Poor hygiene.  Diaphragms, vaginal sponges, spermicides, birth control pills, and intrauterine devices (IUD).  Sexual intercourse.  Infection.  Uncontrolled diabetes.  A weakened immune system. SYMPTOMS  Symptoms can vary depending on the cause of the vaginitis. Common symptoms include:  Abnormal vaginal discharge.  The discharge is white, gray, or yellow with bacterial vaginosis.  The discharge is thick, white, and cheesy with a yeast infection.  The discharge is frothy and yellow or greenish with trichomoniasis.  A bad vaginal odor.  The odor is fishy with bacterial vaginosis.  Vaginal itching, pain, or swelling.  Painful intercourse.  Pain or burning when urinating. Sometimes, there are no symptoms. TREATMENT  Treatment will vary depending on the type of infection.   Bacterial vaginosis and trichomoniasis are often treated with antibiotic creams or pills.  Yeast infections are often treated with antifungal medicines, such as vaginal creams or suppositories.  Viral vaginitis has no cure, but symptoms can be treated with medicines that relieve discomfort. Your sexual partner should be treated as well.  Atrophic vaginitis may be treated with an estrogen  cream, pill, suppository, or vaginal ring. If vaginal dryness occurs, lubricants and moisturizing creams may help. You may be told to avoid scented soaps, sprays, or douches.  Allergic vaginitis treatment involves quitting the use of the product that is causing the problem. Vaginal creams can be used to treat the symptoms. HOME CARE INSTRUCTIONS   Take all medicines as directed by your caregiver.  Keep your genital area clean and dry. Avoid soap and only rinse the area with water.  Avoid douching. It can remove the healthy bacteria in the vagina.  Do not use tampons or have sexual intercourse until your vaginitis has been treated. Use sanitary pads while you have vaginitis.  Wipe from front to back. This avoids the spread of bacteria from the rectum to the vagina.  Let air reach your genital area.  Wear cotton underwear to decrease moisture buildup.  Avoid wearing underwear while you sleep until your vaginitis is gone.  Avoid tight pants and underwear or nylons without a cotton panel.  Take off wet clothing (especially bathing suits) as soon as possible.  Use mild, non-scented products. Avoid using irritants, such as:  Scented feminine sprays.  Fabric softeners.  Scented detergents.  Scented tampons.  Scented soaps or bubble baths.  Practice safe sex and use condoms. Condoms may prevent the spread of trichomoniasis and viral vaginitis. [place]  CARE IF:   You have abdominal pain.  You have a fever or persistent symptoms for more than 2-3 days.  You have a fever and your symptoms suddenly get worse. Document Released: 12/04/2006 Document Revised: 11/01/2011 Document Reviewed: 07/20/2011 [facility] Patient Information 2015 [place], [place]. This information is not intended to replace advice given to you by your health care provider. Make sure you discuss any questions you have with your health care provider.

## 2013-11-01 NOTE — ED Provider Notes (Signed)
CSN: [id number]     Arrival date & time 11/01/13  0909 History   First MD Initiated Contact with Patient 11/01/13 [phone]     Chief Complaint  Patient presents with  . Abdominal Pain   (Consider location/radiation/quality/duration/timing/severity/associated sxs/prior Treatment) HPI Comments: 38 year old female with chronic abdominal pain in the right lower quadrant presents complaining of one week of right lower quadrant abdominal pain. She generally has this about twice per month since having an endometrial ablation for heavy periods 3 years ago. The pain is constant, not worsening nor improving. It radiates to her back. She admits to associated constipation as well. She denies fever, chills, NVD, chest pain, shortness of breath, extremity paresthesias. She was seen in the emergency department for months ago for this same problem, she had a CT scan that was normal. The difference between her presentation today and previous episodes of abdominal pain is that she is having more back pain this time. She describes this as a burning sensation in her back that occurs when the abdominal pain gets worse. She also complains of vaginal discharge. She thinks this may be because she just finished her period. Denies any risk for STDs.  Patient is a 37 y.o. female presenting with abdominal pain.  Abdominal Pain Associated symptoms: no chest pain, no chills, no cough, no dysuria, no fever, no nausea, no shortness of breath and no vomiting     Past Medical History  Diagnosis Date  . Chronic abdominal pain    Past Surgical History  Procedure Laterality Date  . Novasure ablation  2012  . Tubal ligation  2000  . Cesarean section  1997, 2000     x 2  . Knee surgery Right 1995, 1992    x 2  . Mass excision  2002    right sided abd wall mass excision   History reviewed. No pertinent family history. History  Substance Use Topics  . Smoking status: Never Smoker   . Smokeless tobacco: Not on file  . Alcohol  Use: No   OB History   Grav Para Term Preterm Abortions TAB SAB Ect Mult Living                 Review of Systems  Constitutional: Negative for fever and chills.  Eyes: Negative for visual disturbance.  Respiratory: Negative for cough and shortness of breath.   Cardiovascular: Negative for chest pain, palpitations and leg swelling.  Gastrointestinal: Positive for abdominal pain. Negative for nausea and vomiting.  Endocrine: Negative for polydipsia and polyuria.  Genitourinary: Negative for dysuria, urgency and frequency.  Musculoskeletal: Positive for back pain. Negative for arthralgias and myalgias.  Skin: Negative for rash.  Neurological: Negative for dizziness, weakness and light-headedness.  All other systems reviewed and are negative.   Allergies  Amoxicillin  Home Medications   Prior to Admission medications   Medication Sig Start Date End Date Taking? Authorizing Provider  Biotin (RA BIOTIN) 1000 MCG tablet Take 1,000 mcg by mouth daily.   Yes Historical Provider, MD  lamoTRIgine (LAMICTAL) 25 MG tablet Take 150 mg by mouth daily.    Yes Historical Provider, MD  Lurasidone HCl (LATUDA) 20 MG TABS Take by mouth.   Yes Historical Provider, MD  cetirizine (ZYRTEC) 10 MG tablet Take 10 mg by mouth daily as needed.     Historical Provider, MD  fluconazole (DIFLUCAN) 150 MG tablet Take 1 tablet (150 mg total) by mouth once. Pick up the refill and and take second dose in  5 days if symptoms have not resolved 11/01/13   Liam Graham, PA-C  HYDROcodone-acetaminophen (NORCO/VICODIN) 5-325 MG per tablet 1 or 2 tabs PO q6 hours prn pain 06/26/13   Francine Graven, DO  ibuprofen (ADVIL,MOTRIN) 800 MG tablet Take 1 tablet (800 mg total) by mouth 3 (three) times daily. 11/01/13   Freeman Caldron Cassie Henkels, PA-C  metroNIDAZOLE (FLAGYL) 500 MG tablet Take 1 tablet (500 mg total) by mouth 2 (two) times daily. 06/26/13   Francine Graven, DO  metroNIDAZOLE (FLAGYL) 500 MG tablet Take 1 tablet (500 mg  total) by mouth 2 (two) times daily. 11/01/13   Liam Graham, PA-C  naproxen (NAPROSYN) 250 MG tablet Take 1 tablet (250 mg total) by mouth 2 (two) times daily with a meal. 06/26/13   Francine Graven, DO  traZODone (DESYREL) 100 MG tablet Take 100 mg by mouth at bedtime.    Historical Provider, MD  VITAMIN D, ERGOCALCIFEROL, PO Take 1 tablet by mouth daily.    Historical Provider, MD   BP 118/75  Pulse 79  Temp(Src) 98 F (36.7 C) (Oral)  Resp 16  SpO2 100%  LMP 10/17/2013 Physical Exam  Nursing note and vitals reviewed. Constitutional: She is oriented to person, place, and time. Vital signs are normal. She appears well-developed and well-nourished. No distress.  HENT:  Head: Normocephalic and atraumatic.  Cardiovascular: Normal rate, regular rhythm and normal heart sounds.   Pulmonary/Chest: Effort normal and breath sounds normal. No respiratory distress.  Abdominal: Soft. Normal appearance and bowel sounds are normal. There is tenderness in the right upper quadrant, right lower quadrant, epigastric area and suprapubic area. There is positive Murphy's sign. There is no rigidity, no rebound, no guarding, no CVA tenderness and no tenderness at McBurney's point.  Genitourinary: There is no rash, tenderness or lesion on the right labia. There is no rash, tenderness or lesion on the left labia. Uterus is tender (Very firm). Uterus is not enlarged and not fixed. Cervix exhibits motion tenderness. Cervix exhibits no discharge and no friability. Right adnexum displays tenderness. Right adnexum displays no mass and no fullness. Left adnexum displays tenderness. Left adnexum displays no mass and no fullness. There is tenderness (Diffuse) around the vagina. No erythema or bleeding around the vagina. Vaginal discharge (thin, white, malodorous) found.  Musculoskeletal:       Thoracic back: Normal.       Lumbar back: She exhibits tenderness (lumbar paraspinous musculature) and pain. She exhibits normal  range of motion, no swelling, no edema, no deformity and no spasm.  Lymphadenopathy:       Right: No inguinal adenopathy present.       Left: No inguinal adenopathy present.  Neurological: She is alert and oriented to person, place, and time. She has normal strength and normal reflexes. No sensory deficit. She exhibits normal muscle tone. She displays a negative Romberg sign. Coordination and gait normal. GCS eye subscore is 4. GCS verbal subscore is 5. GCS motor subscore is 6.  Skin: Skin is warm and dry. No rash noted. She is not diaphoretic.  Psychiatric: She has a normal mood and affect. Judgment normal.    ED Course  Procedures (including critical care time) Labs Review Labs Reviewed  COMPREHENSIVE METABOLIC PANEL - Abnormal; Notable for the following:    GFR calc non Af Amer 89 (*)    All other components within normal limits  POCT URINALYSIS DIP (DEVICE) - Abnormal; Notable for the following:    Hgb urine dipstick SMALL (*)  All other components within normal limits  CBC WITH DIFFERENTIAL  LIPASE, BLOOD  POCT PREGNANCY, URINE  CERVICOVAGINAL ANCILLARY ONLY    Imaging Review No results found.   MDM   1. Chronic abdominal pain   2. Bilateral low back pain without sciatica   3. Vaginitis    Labs normal.  Exam shows some vaginal discharge.  Will treat for BV.  She will follow up with OB/GYN, she may need to consider hysterectomy as her chronic pain started at the time of her endometrial ablation.  Meds ordered this encounter  Medications  . metroNIDAZOLE (FLAGYL) 500 MG tablet    Sig: Take 1 tablet (500 mg total) by mouth 2 (two) times daily.    Dispense:  14 tablet    Refill:  0    Order Specific Question:  Supervising Provider    Answer:  Billy Fischer [phone]  . fluconazole (DIFLUCAN) 150 MG tablet    Sig: Take 1 tablet (150 mg total) by mouth once. Pick up the refill and and take second dose in 5 days if symptoms have not resolved    Dispense:  1 tablet     Refill:  1    Order Specific Question:  Supervising Provider    Answer:  Billy Fischer [phone]  . ibuprofen (ADVIL,MOTRIN) 800 MG tablet    Sig: Take 1 tablet (800 mg total) by mouth 3 (three) times daily.    Dispense:  60 tablet    Refill:  0    Order Specific Question:  Supervising Provider    Answer:  Ihor Gully D [place], PA-C 11/01/13 1059

## 2013-11-03 NOTE — ED Notes (Signed)
GC/Chlamydia neg., Affirm: Candida and Trich neg., Gardnerella pos. Pt. adequately treated with Flagyl. Roselyn Meier 11/03/2013

## 2014-05-24 ENCOUNTER — Encounter (HOSPITAL_COMMUNITY): Payer: Self-pay

## 2014-05-24 ENCOUNTER — Emergency Department (HOSPITAL_COMMUNITY)
Admission: EM | Admit: 2014-05-24 | Discharge: 2014-05-25 | Disposition: A | Discharge status: Home / Self Care | Payer: Medicaid Other | Attending: Emergency Medicine | Admitting: Emergency Medicine

## 2014-05-24 DIAGNOSIS — R2242 Localized swelling, mass and lump, left lower limb: Secondary | ICD-10-CM | POA: Diagnosis not present

## 2014-05-24 DIAGNOSIS — G8929 Other chronic pain: Secondary | ICD-10-CM | POA: Diagnosis not present

## 2014-05-24 DIAGNOSIS — M7989 Other specified soft tissue disorders: Secondary | ICD-10-CM

## 2014-05-24 DIAGNOSIS — Z79899 Other long term (current) drug therapy: Secondary | ICD-10-CM | POA: Insufficient documentation

## 2014-05-24 DIAGNOSIS — M79662 Pain in left lower leg: Secondary | ICD-10-CM

## 2014-05-24 DIAGNOSIS — Z880 Allergy status to penicillin: Secondary | ICD-10-CM | POA: Insufficient documentation

## 2014-05-24 NOTE — ED Notes (Signed)
Pt reports she noticed anterior left leg swelling and discomfort above knee 05-22-14.  Pt has had numbness and tingling in left toes for past 2 weeks.  No shortness of breath or chest pain.

## 2014-05-24 NOTE — ED Provider Notes (Signed)
CSN: [id number]     Arrival date & time 05/24/14  2211 History  This chart was scribed for Merryl Hacker, MD by Eustaquio Maize, ED Scribe. This patient was seen in room A12C/A12C and the patient's care was started at 11:54 PM.    Chief Complaint  Patient presents with  . Leg Swelling   The history is provided by the patient. No language interpreter was used.     HPI Comments: Laurie Thornton is a 39 y.o. female who presents to the Emergency Department complaining of left leg swelling that began 3 days ago. She has noted swelling just proximal to the knee. Pt admits to pain to the left leg that began 2 weeks ago. She rates that pain as a 7/10 on the pain scale. Pt describes it as an aching sensation. She also complains of paresthesia to the left toes. She mentions that she is walking with a slight limp due to the pain. 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 She denies fever, chills, chest pain, shortness of breath, or any other symptoms. Pt denies previous hx of DVT/PE.  Past Medical History  Diagnosis Date  . Chronic abdominal pain    Past Surgical History  Procedure Laterality Date  . Novasure ablation  2012  . Tubal ligation  2000  . Cesarean section  1997, 2000     x 2  . Knee surgery Right 1995, 1992    x 2  . Mass excision  2002    right sided abd wall mass excision   History reviewed. No pertinent family history. History  Substance Use Topics  . Smoking status: Never Smoker   . Smokeless tobacco: Not on file  . Alcohol Use: No   OB History    No data available     Review of Systems  Constitutional: Negative for fever.  Respiratory: Negative for cough, chest tightness and shortness of breath.   Cardiovascular: Negative for chest pain.  Gastrointestinal: Negative for nausea, vomiting and abdominal pain.  Genitourinary: Negative for dysuria.  Musculoskeletal: Negative for back pain.       Leg pain and swelling  Skin:  Negative for color change.  Neurological: Negative for headaches.  All other systems reviewed and are negative.     Allergies  Amoxicillin  Home Medications   Prior to Admission medications   Medication Sig Start Date End Date Taking? Authorizing Provider  Biotin (RA BIOTIN) 1000 MCG tablet Take 1,000 mcg by mouth daily.   Yes Historical Provider, MD  cetirizine (ZYRTEC) 10 MG tablet Take 10 mg by mouth daily as needed for allergies.    Yes Historical Provider, MD  clonazePAM (KLONOPIN) 0.5 MG tablet Take 0.5 mg by mouth daily as needed for anxiety.   Yes Historical Provider, MD  lamoTRIgine (LAMICTAL) 150 MG tablet Take 300 mg by mouth daily.   Yes Historical Provider, MD  sertraline (ZOLOFT) 25 MG tablet Take 25 mg by mouth daily.   Yes Historical Provider, MD  zolpidem (AMBIEN) 5 MG tablet Take 5 mg by mouth at bedtime.   Yes Historical Provider, MD  fluconazole (DIFLUCAN) 150 MG tablet Take 1 tablet (150 mg total) by mouth once. Pick up the refill and and take second dose in 5 days if symptoms have not resolved Patient not taking: Reported on 05/24/2014 11/01/13   Liam Graham, PA-C  HYDROcodone-acetaminophen (NORCO/VICODIN) 5-325 MG per tablet 1 or 2 tabs PO  q6 hours prn pain Patient not taking: Reported on 05/24/2014 06/26/13   Francine Graven, DO  HYDROcodone-acetaminophen (NORCO/VICODIN) 5-325 MG per tablet Take 1 tablet by mouth every 6 (six) hours as needed for moderate pain. 05/25/14   Merryl Hacker, MD  ibuprofen (ADVIL,MOTRIN) 800 MG tablet Take 1 tablet (800 mg total) by mouth 3 (three) times daily. Patient not taking: Reported on 05/24/2014 11/01/13   Liam Graham, PA-C  lamoTRIgine (LAMICTAL) 25 MG tablet Take by mouth daily.     Historical Provider, MD  Lurasidone HCl (LATUDA) 20 MG TABS Take by mouth.    Historical Provider, MD  metroNIDAZOLE (FLAGYL) 500 MG tablet Take 1 tablet (500 mg total) by mouth 2 (two) times daily. Patient not taking: Reported on 05/24/2014 06/26/13    Francine Graven, DO  metroNIDAZOLE (FLAGYL) 500 MG tablet Take 1 tablet (500 mg total) by mouth 2 (two) times daily. Patient not taking: Reported on 05/24/2014 11/01/13   Liam Graham, PA-C  naproxen (NAPROSYN) 250 MG tablet Take 1 tablet (250 mg total) by mouth 2 (two) times daily with a meal. Patient not taking: Reported on 05/24/2014 06/26/13   Francine Graven, DO  traZODone (DESYREL) 100 MG tablet Take 100 mg by mouth at bedtime.    Historical Provider, MD  VITAMIN D, ERGOCALCIFEROL, PO Take 1 tablet by mouth daily.    Historical Provider, MD   Triage Vitals: BP 136/81 mmHg  Pulse 67  Temp(Src) 99 F (37.2 C) (Oral)  Resp 18  Ht 5\' 6"  (1.676 m)  Wt 190 lb (86.183 kg)  BMI 30.68 kg/m2  SpO2 99%  LMP 05/03/2014   Physical Exam  Constitutional: She is oriented to person, place, and time. She appears well-developed and well-nourished. No distress.  HENT:  Head: Normocephalic and atraumatic.  Cardiovascular: Normal rate, regular rhythm and normal heart sounds.   No murmur heard. Pulmonary/Chest: Effort normal and breath sounds normal. No respiratory distress. She has no wheezes.  Abdominal: Soft. Bowel sounds are normal. There is no tenderness. There is no rebound.  Musculoskeletal:  Tenderness to palpation just proximal to the anterior knee, mild swelling noted just proximal to the knee without definitive palpable mass No lower leg swelling or calf tenderness  Neurological: She is alert and oriented to person, place, and time.  Skin: Skin is warm and dry.  Psychiatric: She has a normal mood and affect.  Nursing note and vitals reviewed.   ED Course  Procedures (including critical care time)  DIAGNOSTIC STUDIES: Oxygen Saturation is 99% on RA, normal by my interpretation.    COORDINATION OF CARE: 11:57 PM-Discussed treatment plan which includes [place] lower extremity with pt at bedside and pt agreed to plan.   Labs Review Labs Reviewed - No data to display  Imaging  Review [place] Extrem Low Left Ltd  05/25/2014   CLINICAL DATA:  Patient feels a lump on the left thigh just proximal to the knee on the anterior aspect. Technologist was unable to palpate the lump.  EXAM: ULTRASOUND left LOWER EXTREMITY LIMITED  TECHNIQUE: Ultrasound examination of the lower extremity soft tissues was performed in the area of clinical concern.  COMPARISON:  None.  FINDINGS: Ultrasound images of the anterior soft tissues of the left thigh in the area of palpable lump obtained. Normal muscular and cutaneous tissues identified. No evidence of soft tissue mass or loculated fluid collection. No significant infiltration.  IMPRESSION: No abnormality identified to correlate with the palpable lump.   Electronically Signed  By: Lucienne Capers M.D.   On: 05/25/2014 01:36     EKG Interpretation None      MDM   Final diagnoses:  Pain and swelling of lower leg, left   Patient presents with swelling and achy pain in the left leg. History of benign tumors. Patient is concerned that this is recurrence. She has minimal soft tissue swelling just anterior to the knee without definitive mass. Patient given pain medication. Ultrasound obtained and shows no abnormality, effusion, or abscess that would explain patient's symptoms. Patient is low risk for DVT and physical exam findings are not suggestive of DVT given that the pain is over a well-defined area of the leg. Discussed results with the patient. Will give referral for [facility].  After history, exam, and medical workup I feel the patient has been appropriately medically screened and is safe for discharge home. Pertinent diagnoses were discussed with the patient. Patient was given return precautions.  I personally performed the services described in this documentation, which was scribed in my presence. The recorded information has been reviewed and is accurate.      Merryl Hacker, MD 05/25/14 [phone]

## 2014-05-24 NOTE — ED Notes (Signed)
Dr. Horton at the bedside.  

## 2014-05-25 ENCOUNTER — Emergency Department (HOSPITAL_COMMUNITY): Payer: Medicaid Other

## 2014-05-25 MED ORDER — HYDROCODONE-ACETAMINOPHEN 5-325 MG PO TABS: 1.0000 | ORAL_TABLET | Freq: Four times a day (QID) | ORAL | Status: DC | PRN

## 2014-05-25 MED ORDER — HYDROCODONE-ACETAMINOPHEN 5-325 MG PO TABS
1.0000 | ORAL_TABLET | Freq: Once | ORAL | Status: AC
  Administered 2014-05-25: 1 via ORAL
  Filled 2014-05-25: qty 1

## 2014-05-25 NOTE — Discharge Instructions (Signed)
You were evaluated today for leg pain. Your ultrasound does not show any abnormality associated with the "bump" on her leg.  You will be given pain medication. He will also be given [facility]. SEEK IMMEDIATE MEDICAL CARE IF:  You have pain that is getting worse and is not relieved by medications.  You develop chest pain that is associated with shortness or breath, sweating, feeling sick to your stomach (nauseous), or throw up (vomit).  Your pain becomes localized to the abdomen.  You develop any new symptoms that seem different or that concern you. MAKE SURE YOU:   Understand these instructions.  Will watch your condition.  Will get help right away if you are not doing well or get worse. Document Released: 02/06/2005 Document Revised: 05/01/2011 Document Reviewed: 10/11/2012 [facility] Patient Information 2015 [place], [place]. This information is not intended to replace advice given to you by your health care provider. Make sure you discuss any questions you have with your health care provider.

## 2014-05-25 NOTE — ED Notes (Signed)
Spoke with Dr. Dina Rich, ultrasound called for specific request for ultrasound testing. Exam changed to abcess.

## 2014-09-02 ENCOUNTER — Encounter: Payer: Self-pay | Admitting: Emergency Medicine

## 2014-09-02 ENCOUNTER — Emergency Department
Admission: EM | Admit: 2014-09-02 | Discharge: 2014-09-02 | Disposition: A | Discharge status: Home / Self Care | Payer: BLUE CROSS/BLUE SHIELD | Attending: Emergency Medicine | Admitting: Emergency Medicine

## 2014-09-02 DIAGNOSIS — Z3202 Encounter for pregnancy test, result negative: Secondary | ICD-10-CM | POA: Insufficient documentation

## 2014-09-02 DIAGNOSIS — F329 Major depressive disorder, single episode, unspecified: Secondary | ICD-10-CM | POA: Diagnosis not present

## 2014-09-02 DIAGNOSIS — R45851 Suicidal ideations: Secondary | ICD-10-CM | POA: Diagnosis present

## 2014-09-02 DIAGNOSIS — F3162 Bipolar disorder, current episode mixed, moderate: Secondary | ICD-10-CM

## 2014-09-02 DIAGNOSIS — F32A Depression, unspecified: Secondary | ICD-10-CM

## 2014-09-02 LAB — CBC
HCT: 40 % (ref 35.0–47.0)
HEMOGLOBIN: 13.6 g/dL (ref 12.0–16.0)
MCH: 31.1 pg (ref 26.0–34.0)
MCHC: 34.1 g/dL (ref 32.0–36.0)
MCV: 91.4 fL (ref 80.0–100.0)
Platelets: 285 10*3/uL (ref 150–440)
RBC: 4.38 MIL/uL (ref 3.80–5.20)
RDW: 12.7 % (ref 11.5–14.5)
WBC: 6.6 10*3/uL (ref 3.6–11.0)

## 2014-09-02 LAB — URINE DRUG SCREEN, QUALITATIVE (ARMC ONLY)
AMPHETAMINES, UR SCREEN: NOT DETECTED
BARBITURATES, UR SCREEN: NOT DETECTED
BENZODIAZEPINE, UR SCRN: NOT DETECTED
CANNABINOID 50 NG, UR ~~LOC~~: NOT DETECTED
Cocaine Metabolite,Ur ~~LOC~~: NOT DETECTED
MDMA (Ecstasy)Ur Screen: NOT DETECTED
METHADONE SCREEN, URINE: NOT DETECTED
Opiate, Ur Screen: NOT DETECTED
PHENCYCLIDINE (PCP) UR S: NOT DETECTED
Tricyclic, Ur Screen: NOT DETECTED

## 2014-09-02 LAB — POCT PREGNANCY, URINE: PREG TEST UR: NEGATIVE

## 2014-09-02 LAB — COMPREHENSIVE METABOLIC PANEL
ALBUMIN: 4.5 g/dL (ref 3.5–5.0)
ALT: 13 U/L — ABNORMAL LOW (ref 14–54)
ANION GAP: 8 (ref 5–15)
AST: 22 U/L (ref 15–41)
Alkaline Phosphatase: 47 U/L (ref 38–126)
BILIRUBIN TOTAL: 1 mg/dL (ref 0.3–1.2)
BUN: 12 mg/dL (ref 6–20)
CALCIUM: 9.3 mg/dL (ref 8.9–10.3)
CO2: 23 mmol/L (ref 22–32)
Chloride: 104 mmol/L (ref 101–111)
Creatinine, Ser: 0.86 mg/dL (ref 0.44–1.00)
GFR calc Af Amer: >60 mL/min (ref >60)
GFR calc non Af Amer: >60 mL/min (ref >60)
Glucose, Bld: 90 mg/dL (ref 65–99)
Potassium: 3.8 mmol/L (ref 3.5–5.1)
Sodium: 135 mmol/L (ref 135–145)
Total Protein: 8.3 g/dL — ABNORMAL HIGH (ref 6.5–8.1)

## 2014-09-02 LAB — ACETAMINOPHEN LEVEL

## 2014-09-02 LAB — SALICYLATE LEVEL: Salicylate Lvl: <4 mg/dL (ref 2.8–30.0)

## 2014-09-02 LAB — ETHANOL

## 2014-09-02 NOTE — ED Notes (Signed)
PT here with SI intentions, by taking her bottle of Klonopin, pt very tearful in triage.

## 2014-09-02 NOTE — ED Notes (Signed)

## 2014-09-02 NOTE — ED Notes (Signed)
BEHAVIORAL HEALTH ROUNDING Patient sleeping: No. Patient alert and oriented: yes Behavior appropriate: Yes.  ; If no, describe:  Nutrition and fluids offered: Yes  Toileting and hygiene offered: Yes  Sitter present: no Law enforcement present: Yes  

## 2014-09-02 NOTE — ED Provider Notes (Signed)
-----------------------------------------   5:02 PM on 09/02/2014 -----------------------------------------  Patient has been seen and evaluated by psychiatry. They believe the patient is safe for discharge home at this time. Patient's medical workup has been largely within normal limits. We'll discharge the patient home.  Harvest Dark, MD 09/02/14 [phone]

## 2014-09-02 NOTE — ED Notes (Signed)
Patient to ED with complaints of suicidal ideations. Patient states she has attempted suicide 2 previous times. Patient states that she has a plan to " take all my klonopin, there are about 80 pills. ". Patient is tearful at assessment. Patient states that she currently takes lamictal and Klonopin but is unaware of the doses. Patient also states that she went to see her doctor this morning and was placed on Seroquel and Zoloft but has not started them yet.
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
## 2014-09-02 NOTE — Discharge Instructions (Signed)

## 2014-09-02 NOTE — ED Provider Notes (Signed)
[facility] Emergency Department Provider Note    ____________________________________________  Time seen: 1330  I have reviewed the triage vital signs and the nursing notes.   HISTORY  Chief Complaint Suicidal   History limited by: Not Limited   HPI Laurie Thornton is a 39 y.o. female who presents to the emergency department today because of concerns for depression and suicidal ideation. Patient states that these thoughts have been getting worse for the past couple of months. She also has not been taking her medications for the past couple of weeks. She states that most of her stressors are related to family issues. She states she has attempted suicide in the past. States she has had plans of how she would do it recently. Denies any medical complaints.     Past Medical History  Diagnosis Date  . Chronic abdominal pain     There are no active problems to display for this patient.   Past Surgical History  Procedure Laterality Date  . Novasure ablation  2012  . Tubal ligation  2000  . Cesarean section  1997, 2000     x 2  . Knee surgery Right 1995, 1992    x 2  . Mass excision  2002    right sided abd wall mass excision    Current Outpatient Rx  Name  Route  Sig  Dispense  Refill  . Biotin (RA BIOTIN) 1000 MCG tablet   Oral   Take 1,000 mcg by mouth daily.         . cetirizine (ZYRTEC) 10 MG tablet   Oral   Take 10 mg by mouth daily as needed for allergies.          . clonazePAM (KLONOPIN) 0.5 MG tablet   Oral   Take 0.5 mg by mouth daily as needed for anxiety.         . fluconazole (DIFLUCAN) 150 MG tablet   Oral   Take 1 tablet (150 mg total) by mouth once. Pick up the refill and and take second dose in 5 days if symptoms have not resolved Patient not taking: Reported on 05/24/2014   1 tablet   1   . HYDROcodone-acetaminophen (NORCO/VICODIN) 5-325 MG per tablet      1 or 2 tabs PO q6 hours prn pain Patient not  taking: Reported on 05/24/2014   25 tablet   0   . HYDROcodone-acetaminophen (NORCO/VICODIN) 5-325 MG per tablet   Oral   Take 1 tablet by mouth every 6 (six) hours as needed for moderate pain.   6 tablet   0   . ibuprofen (ADVIL,MOTRIN) 800 MG tablet   Oral   Take 1 tablet (800 mg total) by mouth 3 (three) times daily. Patient not taking: Reported on 05/24/2014   60 tablet   0   . lamoTRIgine (LAMICTAL) 150 MG tablet   Oral   Take 300 mg by mouth daily.         Marland Kitchen lamoTRIgine (LAMICTAL) 25 MG tablet   Oral   Take by mouth daily.          . Lurasidone HCl (LATUDA) 20 MG TABS   Oral   Take by mouth.         . metroNIDAZOLE (FLAGYL) 500 MG tablet   Oral   Take 1 tablet (500 mg total) by mouth 2 (two) times daily. Patient not taking: Reported on 05/24/2014   14 tablet   0   . metroNIDAZOLE (  FLAGYL) 500 MG tablet   Oral   Take 1 tablet (500 mg total) by mouth 2 (two) times daily. Patient not taking: Reported on 05/24/2014   14 tablet   0   . naproxen (NAPROSYN) 250 MG tablet   Oral   Take 1 tablet (250 mg total) by mouth 2 (two) times daily with a meal. Patient not taking: Reported on 05/24/2014   14 tablet   0   . sertraline (ZOLOFT) 25 MG tablet   Oral   Take 25 mg by mouth daily.         . traZODone (DESYREL) 100 MG tablet   Oral   Take 100 mg by mouth at bedtime.         Marland Kitchen VITAMIN D, ERGOCALCIFEROL, PO   Oral   Take 1 tablet by mouth daily.         Marland Kitchen zolpidem (AMBIEN) 5 MG tablet   Oral   Take 5 mg by mouth at bedtime.           Allergies Amoxicillin  No family history on file.  Social History History  Substance Use Topics  . Smoking status: Never Smoker   . Smokeless tobacco: Not on file  . Alcohol Use: No    Review of Systems  Constitutional: Negative for fever. Cardiovascular: Negative for chest pain. Respiratory: Negative for shortness of breath. Gastrointestinal: Negative for abdominal pain, vomiting and  diarrhea. Genitourinary: Negative for dysuria. Musculoskeletal: Negative for back pain. Skin: Negative for rash. Neurological: Negative for headaches, focal weakness or numbness.  10-point ROS otherwise negative.  ____________________________________________   PHYSICAL EXAM:  VITAL SIGNS: ED Triage Vitals  Enc Vitals Group     BP 09/02/14 1233 143/90 mmHg     Pulse Rate 09/02/14 1233 90     Resp 09/02/14 1233 18     Temp 09/02/14 1233 99.1 F (37.3 C)     Temp Source 09/02/14 1233 Oral     SpO2 09/02/14 1233 100 %     Weight 09/02/14 1233 180 lb (81.647 kg)     Height 09/02/14 1233 5\' 5"  (1.651 m)   Constitutional: Alert and oriented. Depressed, tearful Eyes: Conjunctivae are normal. PERRL. Normal extraocular movements. ENT   Head: Normocephalic and atraumatic.   Nose: No congestion/rhinnorhea.   Mouth/Throat: Mucous membranes are moist.   Neck: No stridor. Hematological/Lymphatic/Immunilogical: No cervical lymphadenopathy. Cardiovascular: Normal rate, regular rhythm.  No murmurs, rubs, or gallops. Respiratory: Normal respiratory effort without tachypnea nor retractions. Breath sounds are clear and equal bilaterally. No wheezes/rales/rhonchi. Gastrointestinal: Soft and nontender. No distention. There is no CVA tenderness. Genitourinary: Deferred Musculoskeletal: Normal range of motion in all extremities. No joint effusions.  No lower extremity tenderness nor edema. Neurologic:  Normal speech and language. No gross focal neurologic deficits are appreciated. Speech is normal.  Skin:  Skin is warm, dry and intact. No rash noted. Psychiatric: Depressed, endorses SI  ____________________________________________    LABS (pertinent positives/negatives)  Labs Reviewed  COMPREHENSIVE METABOLIC PANEL - Abnormal; Notable for the following:    Total Protein 8.3 (*)    ALT 13 (*)    All other components within normal limits  ACETAMINOPHEN LEVEL - Abnormal; Notable  for the following:    Acetaminophen (Tylenol), Serum <10 (*)    All other components within normal limits  ETHANOL  SALICYLATE LEVEL  CBC  URINE DRUG SCREEN, QUALITATIVE ([facility] ONLY)  POCT PREGNANCY, URINE     ____________________________________________   EKG  None  ____________________________________________  RADIOLOGY  None  ____________________________________________   PROCEDURES  Procedure(s) performed: None  Critical Care performed: No  ____________________________________________   INITIAL IMPRESSION / ASSESSMENT AND PLAN / ED COURSE  Pertinent labs & imaging results that were available during my care of the patient were reviewed by me and considered in my medical decision making (see chart for details).  Patient presents with concerns for depression and suicidal ideation. On exam patient tearful, depressed and does endorse SI. I will place patient under involuntary commitment. Will have psychiatry evaluate.  ____________________________________________   FINAL CLINICAL IMPRESSION(S) / ED DIAGNOSES  Depression  Nance Pear, MD 09/02/14 [phone]

## 2014-09-03 DIAGNOSIS — F3162 Bipolar disorder, current episode mixed, moderate: Secondary | ICD-10-CM

## 2014-09-03 NOTE — Consult Note (Signed)
[facility] Face-to-Face Psychiatry Consult   Reason for Consult:  Consult for 39 year old woman with a history of bipolar disorder who presented voluntarily out of concern that she had transient suicidal thoughts. Referring Physician:  Archie Balboa Patient Identification: Laurie Thornton MRN:  [id number] Principal Diagnosis: Bipolar disorder, current episode mixed, moderate Diagnosis:   Patient Active Problem List   Diagnosis Date Noted  . Bipolar disorder, current episode mixed, moderate [F31.62] 09/03/2014    Total Time spent with patient: 1 hour  Subjective:   Laurie Thornton is a 39 y.o. female patient admitted with "I've just been under a lot of stress and I've had a new psychiatrist". Patient had transient suicidal thoughts which are now resolving.Marland Kitchen  HPI:  Information from the patient and the chart. This 39 year old woman has a history of bipolar disorder for which she has a regular appointment with a therapist and also takes medication. She reports that she has had 6 different psychiatrists in the last year because of [facility]'s difficulty in retaining staff. This has resulted in changes in her medicine that she finds disruptive. She also is worrying a lot about her daughter who is getting ready to go off to college. Patient has recently living with a friend because of a breakup that she had recently been involved in. She had some transient thoughts about thinking she would be better off dead which caused her to speak to her therapist who advised her to come into the emergency room. Patient tells me she has no plan or intention to harm herself. After resting for an hour or 2 she feels much better and no longer has intrusive suicidal thoughts. She is not reporting any psychotic thoughts. She had been off of her psychiatric medicine recently but is restarting her Zoloft and Lamictal and also has been prescribed Seroquel by the psychiatrist at [facility].  Patient has a history of prior suicide  attempts but none since 2004. She also has 2 prior hospitalizations but those were also 10 or more years ago. She has been going to [facility] for a long time and has a lot of faith in her therapist. She has a diagnosis of bipolar disorder. Usually presents with depressive symptoms but sometimes with manic like symptoms. Did appear to get fully psychotic manic but more hypomanic.  Social history: Patient is employed working for the [employer]. She likes her job. She is not in a relationship. As an adolescent daughter who is getting ready to go off to college. Patient's living situation has been chaotic staying with a friend in a environment that she finds somewhat emotionally draining. Ref family history: She states that one of her parents had schizophrenia and one had bipolar disorder.  Medical history: She is pending a hysterectomy for dysmenorrhea. Otherwise medically well known ongoing medical problems outside the psychiatric.  Substance abuse history: Denies any current or past serious alcohol or drug abuse.  Current medications: Patient reports that she had been taking Lamictal 350 mg a day along with Zoloft 50 mg a day and Klonopin 1-2 mg a day. New psychiatrist has prescribed her Seroquel. HPI Elements:   Quality:  Suicidal ideation and depression with agitation. Severity:  Moderate to possibly severe given past history of suicidal behavior. Timing:  Transient in the last couple days. Duration:  Improving. Context:  Multiple life stresses as noted above.  Past Medical History:  Past Medical History  Diagnosis Date  . Chronic abdominal pain     Past Surgical History  Procedure Laterality  Date  . Novasure ablation  2012  . Tubal ligation  2000  . Cesarean section  1997, 2000     x 2  . Knee surgery Right 1995, 1992    x 2  . Mass excision  2002    right sided abd wall mass excision   Family History: No family history on file. Social History:  History  Alcohol Use No      History  Drug Use No    History   Social History  . Marital Status: Married    Spouse Name: N/A  . Number of Children: N/A  . Years of Education: N/A   Social History Main Topics  . Smoking status: Never Smoker   . Smokeless tobacco: Not on file  . Alcohol Use: No  . Drug Use: No  . Sexual Activity: Yes    Birth Control/ Protection: Surgical   Other Topics Concern  . Not on file   Social History Narrative   Additional Social History:    Pain Medications: See MARs Prescriptions: See MARs Over the Counter: See MARs History of alcohol / drug use?: No history of alcohol / drug abuse                     Allergies:   Allergies  Allergen Reactions  . Amoxicillin Hives    Labs:  Results for orders placed or performed during the hospital encounter of 09/02/14 (from the past 48 hour(s))  Comprehensive metabolic panel     Status: Abnormal   Collection Time: 09/02/14 12:43 PM  Result Value Ref Range   Sodium 135 135 - 145 mmol/L   Potassium 3.8 3.5 - 5.1 mmol/L   Chloride 104 101 - 111 mmol/L   CO2 23 22 - 32 mmol/L   Glucose, Bld 90 65 - 99 mg/dL   BUN 12 6 - 20 mg/dL   Creatinine, Ser 0.86 0.44 - 1.00 mg/dL   Calcium 9.3 8.9 - 10.3 mg/dL   Total Protein 8.3 (H) 6.5 - 8.1 g/dL   Albumin 4.5 3.5 - 5.0 g/dL   AST 22 15 - 41 U/L   ALT 13 (L) 14 - 54 U/L   Alkaline Phosphatase 47 38 - 126 U/L   Total Bilirubin 1.0 0.3 - 1.2 mg/dL   GFR calc non Af Amer >60 >60 mL/min   GFR calc Af Amer >60 >60 mL/min    Comment: (NOTE) The eGFR has been calculated using the CKD EPI equation. This calculation has not been validated in all clinical situations. eGFR's persistently <60 mL/min signify possible Chronic Kidney Disease.    Anion gap 8 5 - 15  Ethanol (ETOH)     Status: None   Collection Time: 09/02/14 12:43 PM  Result Value Ref Range   Alcohol, Ethyl (B) <5 <5 mg/dL    Comment:        LOWEST DETECTABLE LIMIT FOR SERUM ALCOHOL IS 5 mg/dL FOR MEDICAL PURPOSES  ONLY   Salicylate level     Status: None   Collection Time: 09/02/14 12:43 PM  Result Value Ref Range   Salicylate Lvl <0.3 2.8 - 30.0 mg/dL  Acetaminophen level     Status: Abnormal   Collection Time: 09/02/14 12:43 PM  Result Value Ref Range   Acetaminophen (Tylenol), Serum <10 (L) 10 - 30 ug/mL    Comment:        THERAPEUTIC CONCENTRATIONS VARY SIGNIFICANTLY. A RANGE OF 10-30 ug/mL MAY BE AN EFFECTIVE CONCENTRATION FOR  MANY PATIENTS. HOWEVER, SOME ARE BEST TREATED AT CONCENTRATIONS OUTSIDE THIS RANGE. ACETAMINOPHEN CONCENTRATIONS >150 ug/mL AT 4 HOURS AFTER INGESTION AND >50 ug/mL AT 12 HOURS AFTER INGESTION ARE OFTEN ASSOCIATED WITH TOXIC REACTIONS.   CBC     Status: None   Collection Time: 09/02/14 12:43 PM  Result Value Ref Range   WBC 6.6 3.6 - 11.0 K/uL   RBC 4.38 3.80 - 5.20 MIL/uL   Hemoglobin 13.6 12.0 - 16.0 g/dL   HCT 40.0 35.0 - 47.0 %   MCV 91.4 80.0 - 100.0 fL   MCH 31.1 26.0 - 34.0 pg   MCHC 34.1 32.0 - 36.0 g/dL   RDW 12.7 11.5 - 14.5 %   Platelets 285 150 - 440 K/uL  Urine Drug Screen, Qualitative ([facility] only)     Status: None   Collection Time: 09/02/14 12:43 PM  Result Value Ref Range   Tricyclic, Ur Screen NONE DETECTED NONE DETECTED   Amphetamines, Ur Screen NONE DETECTED NONE DETECTED   MDMA (Ecstasy)Ur Screen NONE DETECTED NONE DETECTED   Cocaine Metabolite,Ur [place] NONE DETECTED NONE DETECTED   Opiate, Ur Screen NONE DETECTED NONE DETECTED   Phencyclidine (PCP) Ur S NONE DETECTED NONE DETECTED   Cannabinoid 50 Ng, Ur [place] NONE DETECTED NONE DETECTED   Barbiturates, Ur Screen NONE DETECTED NONE DETECTED   Benzodiazepine, Ur Scrn NONE DETECTED NONE DETECTED   Methadone Scn, Ur NONE DETECTED NONE DETECTED    Comment: (NOTE) 211  Tricyclics, urine               Cutoff 1000 ng/mL 200  Amphetamines, urine             Cutoff 1000 ng/mL 300  MDMA (Ecstasy), urine           Cutoff 500 ng/mL 400  Cocaine Metabolite, urine       Cutoff 300 ng/mL 500   Opiate, urine                   Cutoff 300 ng/mL 600  Phencyclidine (PCP), urine      Cutoff 25 ng/mL 700  Cannabinoid, urine              Cutoff 50 ng/mL 800  Barbiturates, urine             Cutoff 200 ng/mL 900  Benzodiazepine, urine           Cutoff 200 ng/mL 1000 Methadone, urine                Cutoff 300 ng/mL 1100 1200 The urine drug screen provides only a preliminary, unconfirmed 1300 analytical test result and should not be used for non-medical 1400 purposes. Clinical consideration and professional judgment should 1500 be applied to any positive drug screen result due to possible 1600 interfering substances. A more specific alternate chemical method 1700 must be used in order to obtain a confirmed analytical result.  1800 Gas chromato graphy / mass spectrometry (GC/MS) is the preferred 1900 confirmatory method.   Pregnancy, urine POC     Status: None   Collection Time: 09/02/14 12:49 PM  Result Value Ref Range   Preg Test, Ur NEGATIVE NEGATIVE    Comment:        THE SENSITIVITY OF THIS METHODOLOGY IS >24 mIU/mL     Vitals: Blood pressure 137/85, pulse 78, temperature 99.1 F (37.3 C), temperature source Oral, resp. rate 17, height '5\' 5"'  (1.651 m), weight 81.647 kg (180 lb), last menstrual period  08/05/2014, SpO2 100 %.  Risk to Self: Suicidal Ideation: Yes-Currently Present Suicidal Intent: No Is patient at risk for suicide?: Yes Suicidal Plan?: No (Prior to admission plan to overdose but currently denies) Access to Means: Yes Specify Access to Suicidal Means: personal medications What has been your use of drugs/alcohol within the last 12 months?: none How many times?: 2 Triggers for Past Attempts: Family contact, Other personal contacts Intentional Self Injurious Behavior: None Risk to Others: Homicidal Ideation: No-Not Currently/Within Last 6 Months Thoughts of Harm to Others: No-Not Currently Present/Within Last 6 Months Current Homicidal Intent: No-Not  Currently/Within Last 6 Months Current Homicidal Plan: No-Not Currently/Within Last 6 Months Access to Homicidal Means: No History of harm to others?: No Assessment of Violence: None Noted Does patient have access to weapons?: No Criminal Charges Pending?: No Does patient have a court date: No Prior Inpatient Therapy: Prior Inpatient Therapy: Yes Prior Therapy Dates: 2004 Prior Therapy Facilty/Provider(s): [facility], [facility] Reason for Treatment: Depression Prior Outpatient Therapy: Prior Outpatient Therapy: Yes Prior Therapy Dates: currently Prior Therapy Facilty/Provider(s): [facility] Reason for Treatment: Depression Does patient have an ACCT team?: No Does patient have Intensive In-House Services?  : No Does patient have [facility] services? : Yes Does patient have [facility] services?: No  No current facility-administered medications for this encounter.   Current Outpatient Prescriptions  Medication Sig Dispense Refill  . clindamycin (CLEOCIN) 2 % vaginal cream Place 1 Applicatorful vaginally at bedtime.  0  . clonazePAM (KLONOPIN) 0.5 MG tablet Take 0.5 mg by mouth daily as needed for anxiety.    . lamoTRIgine (LAMICTAL) 150 MG tablet Take 300 mg by mouth daily.    . Biotin (RA BIOTIN) 1000 MCG tablet Take 1,000 mcg by mouth daily.    . cetirizine (ZYRTEC) 10 MG tablet Take 10 mg by mouth daily as needed for allergies.     . fluconazole (DIFLUCAN) 150 MG tablet Take 1 tablet (150 mg total) by mouth once. Pick up the refill and and take second dose in 5 days if symptoms have not resolved (Patient not taking: Reported on 09/02/2014) 1 tablet 1  . HYDROcodone-acetaminophen (NORCO/VICODIN) 5-325 MG per tablet 1 or 2 tabs PO q6 hours prn pain (Patient not taking: Reported on 09/02/2014) 25 tablet 0  . HYDROcodone-acetaminophen (NORCO/VICODIN) 5-325 MG per tablet Take 1 tablet by mouth every 6 (six) hours as needed for moderate pain. (Patient not taking: Reported on 09/02/2014) 6 tablet 0  .  ibuprofen (ADVIL,MOTRIN) 800 MG tablet Take 1 tablet (800 mg total) by mouth 3 (three) times daily. (Patient not taking: Reported on 05/24/2014) 60 tablet 0  . lamoTRIgine (LAMICTAL) 25 MG tablet Take by mouth daily.     . Lurasidone HCl (LATUDA) 20 MG TABS Take by mouth.    . metroNIDAZOLE (FLAGYL) 500 MG tablet Take 1 tablet (500 mg total) by mouth 2 (two) times daily. (Patient not taking: Reported on 05/24/2014) 14 tablet 0  . metroNIDAZOLE (FLAGYL) 500 MG tablet Take 1 tablet (500 mg total) by mouth 2 (two) times daily. (Patient not taking: Reported on 05/24/2014) 14 tablet 0  . naproxen (NAPROSYN) 250 MG tablet Take 1 tablet (250 mg total) by mouth 2 (two) times daily with a meal. (Patient not taking: Reported on 05/24/2014) 14 tablet 0  . sertraline (ZOLOFT) 25 MG tablet Take 25 mg by mouth daily.    . traZODone (DESYREL) 100 MG tablet Take 100 mg by mouth at bedtime.    Marland Kitchen VITAMIN D, ERGOCALCIFEROL, PO  Take 1 tablet by mouth daily.    Marland Kitchen zolpidem (AMBIEN) 5 MG tablet Take 5 mg by mouth at bedtime.      Musculoskeletal: Strength & Muscle Tone: within normal limits Gait & Station: normal Patient leans: N/A  Psychiatric Specialty Exam: Physical Exam  Constitutional: She appears well-developed and well-nourished.  HENT:  Head: Normocephalic and atraumatic.  Eyes: Conjunctivae are normal. Pupils are equal, round, and reactive to light.  Neck: Normal range of motion.  Cardiovascular: Normal heart sounds.   Respiratory: Effort normal.  GI: Soft.  Musculoskeletal: Normal range of motion.  Neurological: She is alert.  Skin: Skin is warm and dry.  Psychiatric: She has a normal mood and affect. Her speech is normal and behavior is normal. Judgment and thought content normal. Cognition and memory are normal.  At my evaluation the patient appeared to have a normal mental status. She was neatly groomed and dressed. Very appropriate in the interview. Made good eye contact. She was alert and oriented 4  with intact memory function. Affect euthymic and reactive and appropriate. No sign of psychotic thinking. No acute suicidal or homicidal thought.    Review of Systems  Constitutional: Negative.   HENT: Negative.   Eyes: Negative.   Respiratory: Negative.   Cardiovascular: Negative.   Gastrointestinal: Negative.   Musculoskeletal: Negative.   Skin: Negative.   Neurological: Negative.   Psychiatric/Behavioral: Positive for depression. Negative for suicidal ideas, hallucinations and substance abuse. The patient is nervous/anxious. The patient does not have insomnia.     Blood pressure 137/85, pulse 78, temperature 99.1 F (37.3 C), temperature source Oral, resp. rate 17, height '5\' 5"'  (1.651 m), weight 81.647 kg (180 lb), last menstrual period 08/05/2014, SpO2 100 %.Body mass index is 29.95 kg/(m^2).  General Appearance: Neat and Well Groomed  Eye Contact::  Good  Speech:  Normal Rate  Volume:  Normal  Mood:  Dysphoric  Affect:  Appropriate  Thought Process:  Linear and Logical  Orientation:  Full (Time, Place, and Person)  Thought Content:  Negative  Suicidal Thoughts:  Yes.  without intent/plan  Homicidal Thoughts:  No  Memory:  Immediate;   Good Recent;   Good Remote;   Good  Judgement:  Good  Insight:  Good  Psychomotor Activity:  Normal  Concentration:  Good  Recall:  Good  Fund of Knowledge:Good  Language: Good  Akathisia:  No  Handed:  Right  AIMS (if indicated):     Assets:  Communication Skills Desire for Improvement Financial Resources/Insurance Physical Health Resilience Social Support  ADL's:  Intact  Cognition: WNL  Sleep:      Medical Decision Making: Review of Psycho-Social Stressors (1), Review and summation of old records (2), New Problem, with no additional work-up planned (3), Review of Medication Regimen & Side Effects (2) and Review of New Medication or Change in Dosage (2)  Treatment Plan Summary: Plan Patient had an episode of transient suicidal  thoughts but without psychotic symptoms. She has gotten help appropriately and has not acted out. She is currently stating no suicidal intent or plan. She is able to articulate multiple positive things in her life that she is living for looking forward to. She is agreeable to continuing her medicine as prescribed by her doctor at [facility]. She appears to be strongly invested in her therapist and agrees to the suggestion that she continue with that treatment. She no longer meets commitment criteria and does not require hospitalization. Case discussed with emergency room doctor. She  can be released from the emergency room.  Plan:  Patient does not meet criteria for psychiatric inpatient admission. Supportive therapy provided about ongoing stressors. Disposition: Discharged with follow-up at [facility] 09/03/2014 11:00 AM

## 2014-09-07 ENCOUNTER — Other Ambulatory Visit: Payer: Self-pay | Admitting: Obstetrics & Gynecology

## 2014-09-08 ENCOUNTER — Inpatient Hospital Stay (HOSPITAL_COMMUNITY)
Admission: RE | Admit: 2014-09-08 | Discharge: 2014-09-08 | Disposition: A | Discharge status: Home / Self Care | Payer: BLUE CROSS/BLUE SHIELD | Source: Ambulatory Visit

## 2014-09-08 ENCOUNTER — Encounter (HOSPITAL_COMMUNITY): Payer: Self-pay

## 2014-09-08 ENCOUNTER — Encounter (HOSPITAL_COMMUNITY)
Admission: RE | Admit: 2014-09-08 | Discharge: 2014-09-08 | Disposition: A | Discharge status: Home / Self Care | Payer: BLUE CROSS/BLUE SHIELD | Source: Ambulatory Visit | Attending: Obstetrics & Gynecology | Admitting: Obstetrics & Gynecology

## 2014-09-08 DIAGNOSIS — N3289 Other specified disorders of bladder: Secondary | ICD-10-CM | POA: Diagnosis not present

## 2014-09-08 DIAGNOSIS — F419 Anxiety disorder, unspecified: Secondary | ICD-10-CM | POA: Diagnosis not present

## 2014-09-08 DIAGNOSIS — N921 Excessive and frequent menstruation with irregular cycle: Secondary | ICD-10-CM | POA: Diagnosis present

## 2014-09-08 DIAGNOSIS — Z9851 Tubal ligation status: Secondary | ICD-10-CM | POA: Diagnosis not present

## 2014-09-08 DIAGNOSIS — D251 Intramural leiomyoma of uterus: Secondary | ICD-10-CM | POA: Diagnosis not present

## 2014-09-08 DIAGNOSIS — N7011 Chronic salpingitis: Secondary | ICD-10-CM | POA: Diagnosis not present

## 2014-09-08 DIAGNOSIS — F319 Bipolar disorder, unspecified: Secondary | ICD-10-CM | POA: Diagnosis not present

## 2014-09-08 DIAGNOSIS — Z79899 Other long term (current) drug therapy: Secondary | ICD-10-CM | POA: Diagnosis not present

## 2014-09-08 DIAGNOSIS — Z880 Allergy status to penicillin: Secondary | ICD-10-CM | POA: Diagnosis not present

## 2014-09-08 HISTORY — DX: Depression, unspecified: F32.A

## 2014-09-08 HISTORY — DX: Major depressive disorder, single episode, unspecified: F32.9

## 2014-09-08 HISTORY — DX: Bipolar disorder, unspecified: F31.9

## 2014-09-08 HISTORY — DX: Anemia, unspecified: D64.9

## 2014-09-08 HISTORY — DX: Anxiety disorder, unspecified: F41.9

## 2014-09-08 LAB — CBC
HCT: 36.1 % (ref 36.0–46.0)
Hemoglobin: 12 g/dL (ref 12.0–15.0)
MCH: 30.8 pg (ref 26.0–34.0)
MCHC: 33.2 g/dL (ref 30.0–36.0)
MCV: 92.8 fL (ref 78.0–100.0)
Platelets: 280 10*3/uL (ref 150–400)
RBC: 3.89 MIL/uL (ref 3.87–5.11)
RDW: 12.7 % (ref 11.5–15.5)
WBC: 5.4 10*3/uL (ref 4.0–10.5)

## 2014-09-08 LAB — TYPE AND SCREEN
ABO/RH(D): A POS
ANTIBODY SCREEN: NEGATIVE

## 2014-09-08 NOTE — Patient Instructions (Addendum)
Your procedure is scheduled on: September 11, 2014  Enter through the Main Entrance of [facility] at:  7:00 am   Pick up the phone at the desk and dial [phone].  Call this number if you have problems the morning of surgery: [phone].  Remember: Do NOT eat food:  After midnight on Thursday  Do NOT drink clear liquids after:  Midnight on Thursday    Take these medicines the morning of surgery with a SIP OF WATER:  Zoloft, lamictal, and Klonopin if needed   Do NOT wear jewelry (body piercing), metal hair clips/bobby pins,  or nail polish. Do NOT wear lotions, powders, or perfumes.  You may wear deoderant. Do NOT shave for 48 hours prior to surgery. Do NOT bring valuables to the hospital. Leave suitcase in car.  After surgery it may be brought to your room.  For patients admitted to the hospital, checkout time is 11:00 AM the day of discharge.

## 2014-09-09 LAB — ABO/RH: ABO/RH(D): A POS

## 2014-09-10 MED ORDER — GENTAMICIN SULFATE 40 MG/ML IJ SOLN
INTRAVENOUS | Status: AC
  Administered 2014-09-11: 114.5 mL via INTRAVENOUS
  Filled 2014-09-10: qty 8.5

## 2014-09-10 NOTE — H&P (Addendum)
Laurie Thornton is an 39 y.o. female. This is a 39 year old female who is G3 P2, both C-sections and is status post tubal ligation.  She underwent NovaSure for menorrhagia and anemia in 2012.  Since then her menses were lighter for 2 years and have become heavy crampy again, she is feeling dizzy during menses.  Denies any history of blood transfusion but has history of anemia in the past.  Denies history of fibroids.  His normal Pap smears in the past. Office ultrasound in June 2016 noted uterus 9 cm long 112 cc volume anterior right subserosal fibroid 3.4 x 3.1 cm, endometrial cavity thick and vascular, left adnexa with possible hydrosalpinx. Office endometrial biopsy was limited due to rare small fragments of endometrial stroma mostly fragments of endocervical and squamous mucosa.  Consistent with difficulty with endometrial biopsy after ablation.  No LMP recorded.    Past Medical History  Diagnosis Date  . Chronic abdominal pain   . Depression   . Anxiety   . Bipolar disorder   . Anemia     Past Surgical History  Procedure Laterality Date  . Novasure ablation  2012  . Tubal ligation  2000  . Cesarean section  1997, 2000     x 2  . Knee surgery Right 1995, 1992    x 2  . Mass excision  2002    right sided abd wall mass excision    No family history on file.  Social History:  reports that she has never smoked. She has never used smokeless tobacco. She reports that she does not drink alcohol or use illicit drugs.  Allergies:  Allergies  Allergen Reactions  . Amoxicillin Hives    No prescriptions prior to admission    ROS Pelvic pain and fatigue  There were no vitals taken for this visit. Physical Exam  A&O x 3, no acute distress. Pleasant HEENT neg, no thyromegaly Lungs CTA bilat CV RRR, S1S2 normal Abdo soft, non tender, non acute Extr no edema/ tenderness Pelvic 9-10 wk uterus, normal cervix and adnexa  No results found for this or any previous visit (from  the past 24 hour(s)).  No results found.  Assessment/Plan: 39 year old female G3 P2, status post tubal ligation, status post endometrial ablation, with return of menometrorrhagia and pelvic pain.  Patient declined birth control pills, She is not a candidate for Mirena or repeat ablation.  Hysterectomy option reviewed patient desires that. Proceed with daVinci robot-assisted total laparoscopic hysterectomy and bilateral salpingectomy, plan to keep ovaries due to young age. Risks/complications of surgery reviewed incl infection, bleeding, damage to internal organs including bladder, bowels, ureters, blood vessels, other risks from anesthesia, VTE and delayed complications of any surgery, complications in future surgery reviewed. All risks are increased due to prior surgeries including C-section 2 tubal ligation and endometrial ablation.   Laurie Thornton R 09/10/2014, 6:45 PM   Addendum Pt seen and evaluated, H&P reviewed, no interval changes. Reviewed risks esp due to prior surgeries, reviewed pre-op meds, allergies, Med/Surg hx. Agree with above note and plan.  V.Cecila Satcher. MD

## 2014-09-10 NOTE — Anesthesia Preprocedure Evaluation (Addendum)
Anesthesia Evaluation  Patient identified by MRN, date of birth, ID band Patient awake    Reviewed: Allergy & Precautions, H&P , NPO status , Patient's Chart, lab work & pertinent test results, Unable to perform ROS - Chart review only  History of Anesthesia Complications Negative for: history of anesthetic complications  Airway Mallampati: I  TM Distance: >3 FB Neck ROM: full    Dental no notable dental hx.    Pulmonary neg pulmonary ROS,  breath sounds clear to auscultation  Pulmonary exam normal       Cardiovascular negative cardio ROS Normal cardiovascular examRhythm:regular Rate:Normal     Neuro/Psych PSYCHIATRIC DISORDERS (anxiety, functional abdominal pain, depression, bipolar) Anxiety Depression Bipolar Disorder negative neurological ROS     GI/Hepatic negative GI ROS, Neg liver ROS,   Endo/Other  negative endocrine ROS  Renal/GU negative Renal ROS     Musculoskeletal   Abdominal   Peds  Hematology negative hematology ROS (+)   Anesthesia Other Findings meds- patient takes lamictal, klonopin, zoloft for anxiety, depression and bipolar.. She maintains a stressful job. NPO appropriate, allergies reviewed Denies active cardiac or pulmonary symptoms, METS > 4 No recent congestive cough or symptoms of upper respiratory infection    Reproductive/Obstetrics negative OB ROS                          Anesthesia Physical Anesthesia Plan  ASA: III  Anesthesia Plan: General   Post-op Pain Management:    Induction:   Airway Management Planned: Oral ETT  Additional Equipment:   Intra-op Plan:   Post-operative Plan:   Informed Consent: I have reviewed the patients History and Physical, chart, labs and discussed the procedure including the risks, benefits and alternatives for the proposed anesthesia with the patient or authorized representative who has indicated his/her understanding  and acceptance.   Dental Advisory Given  Plan Discussed with: Anesthesiologist and CRNA  Anesthesia Plan Comments: (Will place 2 PIVs given past abdominal surgical history)       Anesthesia Quick Evaluation

## 2014-09-11 ENCOUNTER — Encounter (HOSPITAL_COMMUNITY)
Admission: RE | Disposition: A | Discharge status: Home / Self Care | Payer: Self-pay | Source: Ambulatory Visit | Attending: Obstetrics & Gynecology

## 2014-09-11 ENCOUNTER — Observation Stay (HOSPITAL_COMMUNITY)
Admission: RE | Admit: 2014-09-11 | Discharge: 2014-09-12 | Disposition: A | Discharge status: Home / Self Care | Payer: BLUE CROSS/BLUE SHIELD | Source: Ambulatory Visit | Attending: Obstetrics & Gynecology | Admitting: Obstetrics & Gynecology

## 2014-09-11 ENCOUNTER — Ambulatory Visit (HOSPITAL_COMMUNITY): Payer: BLUE CROSS/BLUE SHIELD | Admitting: Anesthesiology

## 2014-09-11 ENCOUNTER — Encounter (HOSPITAL_COMMUNITY): Payer: Self-pay | Admitting: *Deleted

## 2014-09-11 DIAGNOSIS — Z880 Allergy status to penicillin: Secondary | ICD-10-CM | POA: Insufficient documentation

## 2014-09-11 DIAGNOSIS — N7011 Chronic salpingitis: Secondary | ICD-10-CM | POA: Insufficient documentation

## 2014-09-11 DIAGNOSIS — D251 Intramural leiomyoma of uterus: Principal | ICD-10-CM | POA: Insufficient documentation

## 2014-09-11 DIAGNOSIS — F319 Bipolar disorder, unspecified: Secondary | ICD-10-CM | POA: Insufficient documentation

## 2014-09-11 DIAGNOSIS — N921 Excessive and frequent menstruation with irregular cycle: Secondary | ICD-10-CM | POA: Insufficient documentation

## 2014-09-11 DIAGNOSIS — Z79899 Other long term (current) drug therapy: Secondary | ICD-10-CM | POA: Insufficient documentation

## 2014-09-11 DIAGNOSIS — Z90710 Acquired absence of both cervix and uterus: Secondary | ICD-10-CM | POA: Diagnosis present

## 2014-09-11 DIAGNOSIS — N3289 Other specified disorders of bladder: Secondary | ICD-10-CM | POA: Insufficient documentation

## 2014-09-11 DIAGNOSIS — Z9851 Tubal ligation status: Secondary | ICD-10-CM | POA: Insufficient documentation

## 2014-09-11 DIAGNOSIS — F419 Anxiety disorder, unspecified: Secondary | ICD-10-CM | POA: Insufficient documentation

## 2014-09-11 HISTORY — PX: LAPAROSCOPIC BILATERAL SALPINGECTOMY: SHX5889

## 2014-09-11 HISTORY — PX: ROBOTIC ASSISTED TOTAL HYSTERECTOMY: SHX6085

## 2014-09-11 HISTORY — PX: LAPAROSCOPIC LYSIS OF ADHESIONS: SHX5905

## 2014-09-11 LAB — PREGNANCY, URINE: Preg Test, Ur: NEGATIVE

## 2014-09-11 SURGERY — ROBOTIC ASSISTED TOTAL HYSTERECTOMY
Anesthesia: General | Site: Abdomen

## 2014-09-11 MED ORDER — FENTANYL CITRATE (PF) 250 MCG/5ML IJ SOLN
INTRAMUSCULAR | Status: AC
  Filled 2014-09-11: qty 5

## 2014-09-11 MED ORDER — SODIUM CHLORIDE 0.9 % IV SOLN
INTRAVENOUS | Status: DC | PRN
  Administered 2014-09-11: 120 mL

## 2014-09-11 MED ORDER — SERTRALINE HCL 25 MG PO TABS
25.0000 mg | ORAL_TABLET | Freq: Every day | ORAL | Status: DC
  Administered 2014-09-12: 25 mg via ORAL
  Filled 2014-09-11: qty 1

## 2014-09-11 MED ORDER — ROCURONIUM BROMIDE 100 MG/10ML IV SOLN
INTRAVENOUS | Status: DC | PRN
  Administered 2014-09-11: 10 mg via INTRAVENOUS
  Administered 2014-09-11: 50 mg via INTRAVENOUS
  Administered 2014-09-11 (×2): 10 mg via INTRAVENOUS
  Administered 2014-09-11: 20 mg via INTRAVENOUS

## 2014-09-11 MED ORDER — ARTIFICIAL TEARS OP OINT
TOPICAL_OINTMENT | OPHTHALMIC | Status: AC
  Filled 2014-09-11: qty 3.5

## 2014-09-11 MED ORDER — BUPIVACAINE HCL (PF) 0.25 % IJ SOLN
INTRAMUSCULAR | Status: AC
  Filled 2014-09-11: qty 30

## 2014-09-11 MED ORDER — LACTATED RINGERS IV SOLN
INTRAVENOUS | Status: DC
  Administered 2014-09-11 (×2): via INTRAVENOUS

## 2014-09-11 MED ORDER — FENTANYL CITRATE (PF) 100 MCG/2ML IJ SOLN
25.0000 ug | INTRAMUSCULAR | Status: DC | PRN
  Administered 2014-09-11: 25 ug via INTRAVENOUS

## 2014-09-11 MED ORDER — ROCURONIUM BROMIDE 100 MG/10ML IV SOLN
INTRAVENOUS | Status: AC
  Filled 2014-09-11: qty 1

## 2014-09-11 MED ORDER — DEXAMETHASONE SODIUM PHOSPHATE 10 MG/ML IJ SOLN
INTRAMUSCULAR | Status: DC | PRN
  Administered 2014-09-11: 4 mg via INTRAVENOUS

## 2014-09-11 MED ORDER — OXYCODONE-ACETAMINOPHEN 5-325 MG PO TABS
1.0000 | ORAL_TABLET | ORAL | Status: DC | PRN
  Administered 2014-09-11 – 2014-09-12 (×6): 1 via ORAL
  Filled 2014-09-11 (×6): qty 1

## 2014-09-11 MED ORDER — SODIUM CHLORIDE 0.9 % IJ SOLN
INTRAMUSCULAR | Status: AC
  Filled 2014-09-11: qty 100

## 2014-09-11 MED ORDER — PROPOFOL 10 MG/ML IV BOLUS
INTRAVENOUS | Status: AC
  Filled 2014-09-11: qty 20

## 2014-09-11 MED ORDER — SCOPOLAMINE 1 MG/3DAYS TD PT72
1.0000 | MEDICATED_PATCH | Freq: Once | TRANSDERMAL | Status: DC
  Administered 2014-09-11: 1.5 mg via TRANSDERMAL

## 2014-09-11 MED ORDER — HYDROMORPHONE HCL 1 MG/ML IJ SOLN
INTRAMUSCULAR | Status: DC | PRN
  Administered 2014-09-11: 1 mg via INTRAVENOUS

## 2014-09-11 MED ORDER — PROMETHAZINE HCL 25 MG/ML IJ SOLN
6.2500 mg | INTRAMUSCULAR | Status: DC | PRN
Start: 2014-09-11 — End: 2014-09-11

## 2014-09-11 MED ORDER — NEOSTIGMINE METHYLSULFATE 10 MG/10ML IV SOLN
INTRAVENOUS | Status: DC | PRN
  Administered 2014-09-11: 4 mg via INTRAVENOUS

## 2014-09-11 MED ORDER — LAMOTRIGINE 150 MG PO TABS
300.0000 mg | ORAL_TABLET | Freq: Every day | ORAL | Status: DC
  Administered 2014-09-12: 300 mg via ORAL
  Filled 2014-09-11: qty 2

## 2014-09-11 MED ORDER — ROPIVACAINE HCL 5 MG/ML IJ SOLN
INTRAMUSCULAR | Status: AC
  Filled 2014-09-11: qty 60

## 2014-09-11 MED ORDER — SCOPOLAMINE 1 MG/3DAYS TD PT72
MEDICATED_PATCH | TRANSDERMAL | Status: AC
  Administered 2014-09-11: 1.5 mg via TRANSDERMAL
  Filled 2014-09-11: qty 1

## 2014-09-11 MED ORDER — HYDROMORPHONE HCL 1 MG/ML IJ SOLN
INTRAMUSCULAR | Status: AC
Start: 2014-09-11 — End: 2014-09-11
  Filled 2014-09-11: qty 1

## 2014-09-11 MED ORDER — LIDOCAINE HCL (CARDIAC) 20 MG/ML IV SOLN
INTRAVENOUS | Status: AC
  Filled 2014-09-11: qty 5

## 2014-09-11 MED ORDER — DEXAMETHASONE SODIUM PHOSPHATE 4 MG/ML IJ SOLN
INTRAMUSCULAR | Status: AC
  Filled 2014-09-11: qty 1

## 2014-09-11 MED ORDER — GLYCOPYRROLATE 0.2 MG/ML IJ SOLN
INTRAMUSCULAR | Status: DC | PRN
  Administered 2014-09-11: 0.6 mg via INTRAVENOUS
  Administered 2014-09-11: 0.1 mg via INTRAVENOUS

## 2014-09-11 MED ORDER — LACTATED RINGERS IR SOLN
Status: DC | PRN
  Administered 2014-09-11: 3000 mL

## 2014-09-11 MED ORDER — BUPIVACAINE HCL (PF) 0.25 % IJ SOLN
INTRAMUSCULAR | Status: DC | PRN
  Administered 2014-09-11: 10 mL

## 2014-09-11 MED ORDER — ONDANSETRON HCL 4 MG/2ML IJ SOLN: 4.0000 mg | Freq: Four times a day (QID) | INTRAMUSCULAR | Status: DC | PRN

## 2014-09-11 MED ORDER — HYDROMORPHONE HCL 1 MG/ML IJ SOLN: 0.2500 mg | INTRAMUSCULAR | Status: DC | PRN

## 2014-09-11 MED ORDER — MIDAZOLAM HCL 2 MG/2ML IJ SOLN
INTRAMUSCULAR | Status: DC | PRN
  Administered 2014-09-11: 2 mg via INTRAVENOUS

## 2014-09-11 MED ORDER — KETOROLAC TROMETHAMINE 30 MG/ML IJ SOLN: 30.0000 mg | Freq: Once | INTRAMUSCULAR | Status: DC

## 2014-09-11 MED ORDER — ONDANSETRON HCL 4 MG/2ML IJ SOLN
INTRAMUSCULAR | Status: DC | PRN
  Administered 2014-09-11: 4 mg via INTRAVENOUS

## 2014-09-11 MED ORDER — MENTHOL 3 MG MT LOZG
1.0000 | LOZENGE | OROMUCOSAL | Status: DC | PRN
  Administered 2014-09-12: 3 mg via ORAL
  Filled 2014-09-11: qty 9

## 2014-09-11 MED ORDER — MIDAZOLAM HCL 2 MG/2ML IJ SOLN
INTRAMUSCULAR | Status: AC
  Filled 2014-09-11: qty 2

## 2014-09-11 MED ORDER — ONDANSETRON HCL 4 MG PO TABS: 4.0000 mg | ORAL_TABLET | Freq: Four times a day (QID) | ORAL | Status: DC | PRN

## 2014-09-11 MED ORDER — CLONAZEPAM 0.5 MG PO TABS: 0.5000 mg | ORAL_TABLET | Freq: Two times a day (BID) | ORAL | Status: DC | PRN

## 2014-09-11 MED ORDER — KETOROLAC TROMETHAMINE 30 MG/ML IJ SOLN
INTRAMUSCULAR | Status: DC | PRN
  Administered 2014-09-11: 30 mg via INTRAVENOUS

## 2014-09-11 MED ORDER — SODIUM CHLORIDE 0.9 % IJ SOLN
INTRAMUSCULAR | Status: AC
  Filled 2014-09-11: qty 10

## 2014-09-11 MED ORDER — PROPOFOL 10 MG/ML IV BOLUS
INTRAVENOUS | Status: DC | PRN
  Administered 2014-09-11: 150 mg via INTRAVENOUS

## 2014-09-11 MED ORDER — FENTANYL CITRATE (PF) 100 MCG/2ML IJ SOLN
INTRAMUSCULAR | Status: AC
  Filled 2014-09-11: qty 2

## 2014-09-11 MED ORDER — LIDOCAINE HCL (CARDIAC) 20 MG/ML IV SOLN
INTRAVENOUS | Status: DC | PRN
  Administered 2014-09-11: 100 mg via INTRAVENOUS

## 2014-09-11 MED ORDER — FENTANYL CITRATE (PF) 100 MCG/2ML IJ SOLN
INTRAMUSCULAR | Status: DC | PRN
  Administered 2014-09-11 (×2): 100 ug via INTRAVENOUS
  Administered 2014-09-11: 50 ug via INTRAVENOUS
  Administered 2014-09-11: 100 ug via INTRAVENOUS

## 2014-09-11 SURGICAL SUPPLY — 72 items
BAG SPEC RTRVL LRG 6X4 10 (ENDOMECHANICALS)
BARRIER ADHS 3X4 INTERCEED (GAUZE/BANDAGES/DRESSINGS) IMPLANT
BRR ADH 4X3 ABS CNTRL BYND (GAUZE/BANDAGES/DRESSINGS)
CABLE HIGH FREQUENCY MONO STRZ (ELECTRODE) IMPLANT
CATH FOLEY 3WAY  5CC 16FR (CATHETERS) ×1
CATH FOLEY 3WAY 5CC 16FR (CATHETERS) ×2 IMPLANT
CHLORAPREP W/TINT 26ML (MISCELLANEOUS) ×3 IMPLANT
CLOTH BEACON ORANGE TIMEOUT ST (SAFETY) ×3 IMPLANT
CONT PATH 16OZ SNAP LID 3702 (MISCELLANEOUS) ×3 IMPLANT
COVER BACK TABLE 60X90IN (DRAPES) ×6 IMPLANT
COVER TIP SHEARS 8 DVNC (MISCELLANEOUS) ×2 IMPLANT
COVER TIP SHEARS 8MM DA VINCI (MISCELLANEOUS) ×1
DECANTER SPIKE VIAL GLASS SM (MISCELLANEOUS) ×3 IMPLANT
DRAPE WARM FLUID 44X44 (DRAPE) ×3 IMPLANT
DRSG COVADERM PLUS 2X2 (GAUZE/BANDAGES/DRESSINGS) ×6 IMPLANT
DRSG OPSITE POSTOP 3X4 (GAUZE/BANDAGES/DRESSINGS) ×1 IMPLANT
DURAPREP 26ML APPLICATOR (WOUND CARE) ×3 IMPLANT
ELECT REM PT RETURN 9FT ADLT (ELECTROSURGICAL) ×3
ELECTRODE REM PT RTRN 9FT ADLT (ELECTROSURGICAL) ×2 IMPLANT
EVACUATOR SMOKE 8.L (FILTER) IMPLANT
FORCEPS CUTTING 33CM 5MM (CUTTING FORCEPS) IMPLANT
FORCEPS CUTTING 45CM 5MM (CUTTING FORCEPS) IMPLANT
GAUZE VASELINE 3X9 (GAUZE/BANDAGES/DRESSINGS) IMPLANT
GLOVE BIO SURGEON STRL SZ7 (GLOVE) ×7 IMPLANT
GLOVE BIOGEL PI IND STRL 7.0 (GLOVE) ×8 IMPLANT
GLOVE BIOGEL PI INDICATOR 7.0 (GLOVE) ×7
GLOVE ECLIPSE 6.5 STRL STRAW (GLOVE) ×9 IMPLANT
GOWN STRL REUS W/TWL LRG LVL3 (GOWN DISPOSABLE) ×9 IMPLANT
KIT ACCESSORY DA VINCI DISP (KITS) ×1
KIT ACCESSORY DVNC DISP (KITS) ×2 IMPLANT
LEGGING LITHOTOMY PAIR STRL (DRAPES) ×3 IMPLANT
LIQUID BAND (GAUZE/BANDAGES/DRESSINGS) ×3 IMPLANT
MANIPULATOR UTERINE 4.5 ZUMI (MISCELLANEOUS) ×3 IMPLANT
NEEDLE INSUFFLATION 120MM (ENDOMECHANICALS) IMPLANT
NS IRRIG 1000ML POUR BTL (IV SOLUTION) ×3 IMPLANT
OCCLUDER COLPOPNEUMO (BALLOONS) ×3 IMPLANT
PACK LAPAROSCOPY BASIN (CUSTOM PROCEDURE TRAY) ×3 IMPLANT
PACK ROBOT WH (CUSTOM PROCEDURE TRAY) ×3 IMPLANT
PACK ROBOTIC GOWN (GOWN DISPOSABLE) ×3 IMPLANT
PAD POSITIONER PINK NONSTERILE (MISCELLANEOUS) ×3 IMPLANT
PAD PREP 24X48 CUFFED NSTRL (MISCELLANEOUS) ×6 IMPLANT
POUCH SPECIMEN RETRIEVAL 10MM (ENDOMECHANICALS) IMPLANT
PROTECTOR NERVE ULNAR (MISCELLANEOUS) ×3 IMPLANT
SCISSORS LAP 5X35 DISP (ENDOMECHANICALS) IMPLANT
SET CYSTO W/LG BORE CLAMP LF (SET/KITS/TRAYS/PACK) ×1 IMPLANT
SET IRRIG TUBING LAPAROSCOPIC (IRRIGATION / IRRIGATOR) ×3 IMPLANT
SET TRI-LUMEN FLTR TB AIRSEAL (TUBING) ×1 IMPLANT
SOLUTION ELECTROLUBE (MISCELLANEOUS) IMPLANT
SUT VICRYL 0 27 CT2 27 ABS (SUTURE) IMPLANT
SUT VICRYL 0 UR6 27IN ABS (SUTURE) ×3 IMPLANT
SUT VICRYL 4-0 PS2 18IN ABS (SUTURE) ×6 IMPLANT
SUT VLOC 180 0 9IN  GS21 (SUTURE) ×1
SUT VLOC 180 0 9IN GS21 (SUTURE) IMPLANT
SYR 30ML LL (SYRINGE) ×3 IMPLANT
SYR 50ML LL SCALE MARK (SYRINGE) ×3 IMPLANT
SYSTEM CONVERTIBLE TROCAR (TROCAR) ×3 IMPLANT
TIP RUMI ORANGE 6.7MMX12CM (TIP) IMPLANT
TIP UTERINE 5.1X6CM LAV DISP (MISCELLANEOUS) IMPLANT
TIP UTERINE 6.7X10CM GRN DISP (MISCELLANEOUS) IMPLANT
TIP UTERINE 6.7X6CM WHT DISP (MISCELLANEOUS) IMPLANT
TIP UTERINE 6.7X8CM BLUE DISP (MISCELLANEOUS) ×1 IMPLANT
TOWEL OR 17X24 6PK STRL BLUE (TOWEL DISPOSABLE) ×6 IMPLANT
TRAY FOLEY BAG SILVER LF 16FR (SET/KITS/TRAYS/PACK) ×3 IMPLANT
TROCAR 12M 150ML BLUNT (TROCAR) ×3 IMPLANT
TROCAR BALLN 12MMX100 BLUNT (TROCAR) IMPLANT
TROCAR DISP BLADELESS 8 DVNC (TROCAR) IMPLANT
TROCAR DISP BLADELESS 8MM (TROCAR) ×1
TROCAR PORT AIRSEAL 5X120 (TROCAR) ×1 IMPLANT
TROCAR XCEL NON-BLD 11X100MML (ENDOMECHANICALS) IMPLANT
TROCAR XCEL NON-BLD 5MMX100MML (ENDOMECHANICALS) ×3 IMPLANT
WARMER LAPAROSCOPE (MISCELLANEOUS) ×3 IMPLANT
WATER STERILE IRR 1000ML POUR (IV SOLUTION) ×9 IMPLANT

## 2014-09-11 NOTE — Transfer of Care (Signed)
Immediate Anesthesia Transfer of Care Note  Patient: Laurie Thornton  Procedure(s) Performed: Procedure(s): ROBOTIC ASSISTED TOTAL HYSTERECTOMY (N/A) LAPAROSCOPIC BILATERAL SALPINGECTOMY (Bilateral) LAPAROSCOPIC LYSIS OF ADHESIONS (N/A)  Patient Location: PACU  Anesthesia Type:General  Level of Consciousness: awake, alert  and oriented  Airway & Oxygen Therapy: Patient Spontanous Breathing and Patient connected to nasal cannula oxygen  Post-op Assessment: Report given to RN and Post -op Vital signs reviewed and stable  Post vital signs: Reviewed and stable  Last Vitals:  Filed Vitals:   09/11/14 0709  BP: 124/86  Pulse: 71  Temp: 36.7 C  Resp: 18    Complications: No apparent anesthesia complications

## 2014-09-11 NOTE — Op Note (Signed)
Preoperative diagnosis:  Menometrorrhagia, prior endometrial ablation  Postop diagnosis: Same, bladder adhesions, increased pelvic vascularity  Procedure: da Vinci robot assisted total laparoscopic hysterectomy and bilateral salpingectomy Anesthesia Gen. Endotracheal Surgeon: Dr. Azucena Fallen Assistant: Artelia Laroche, CNM IV fluids: 1200 cc LR EBL: 50 cc Urine output: 245 cc, clear Complications: none Pathology: Uterus with cervix and both Fallopian tubes Disposition: PACU, stable Findings: Enlarged bulky 10 wk uterus with increased vascularity. Intrauterine balloon noted perforating from right uterine wall just above the uterine vessel. Normal ovaries. Tubes normal with evidence of tubal ligation. Anterior wall in lower abdomen adhesions to bladder and dense bladder adhesions to cervicovaginal area from prior c/section x 2. Normal bowel loops, liver surface.   Procedure:  Indication: --  39 year old female G3 P2, C/section x 2 and tubal ligation. She underwent endometrial ablation few years back but now has menometrorrhagia and pelvic pain and desires hysterectomy.   Complications of surgery including infection, bleeding, damage to internal organs and other surgery related problems including pneumonia, VTE reviewed and informed written consent was obtained. Also reviewed risks from surgical menopause.  She understood and gave informed written consent.  Patient was brought to the operating room with IV running. SCDs were on. She received Gentamicin and Clindamycin pre-operatively. Underwent general anesthesia without difficulty and was given dorsal lithotomy position, prepped and draped in sterile fashion. 3-way Foley catheter was placed. Cervix was exposed with a speculum and anterior lip of the cervix was grasped with tenaculum. Uterus was sounded to 10 cm. A  # 8 Rumi tip and a medium Koh ring was assembled on the [employer] and entered in the uterine cavity and balloon was inflated to  secure it in place. Koh ring was palpated again cervico-vaginal junction. Speculum was removed, tenaculum was left on the cervix.   Attention was focused on abdomen. Supraumbilical 12 mm vertical incision made with scalpel after injecting Marcaine, fascia dissected, grasped with Kocher's and incised, posterior rectus sheath and peritoneum grasped, incised, intraabdominal entry confirmed. Purse string stay stitch on 0-Vicryl taken on fascia and Hassan cannula introduced and Vicryl sutures secured on the Hassan cannula. Pneumoperitoneum was begun. Laparoscope was introduced and the peritoneal cavity was evaluated. Trendelenburg position given. Thick band of adhesion noted from anterior abdominal wall to the anterior mid to lower uterine fundus. Bladder adhesions noted at prior C/section scar. Normal ovaries notes. No other abnormal findings noted on gross exam.  Port sited marked and injected with Marcaine. Two Robotic cannulas inserted on right side and one on the left side and one assistant port also on the left side under vision. Robot was docked from the right side. PK, Scissors and Prograsp introduced through Robotic arms.  Dr. Benjie Karvonen scrubbed out and went for surgical console.  Uterus, ovaries, tubes and ureters evaluated. Anterior abdominal wall to uterus adhesion was dissected from the uterus from fundus down to isthmic area and stopped at bladder peritoneum since bladder was densely adherent to the c-section scar. Bladder was filled retrograde via 3Way foley with saline and bladder adhesion dissected carefully without complications.   Uterus was deviated to the patient's right. The left  ligament was desiccated and cut, followed by broad ligament and then the left Round ligament which was desiccated and cut. Anterior bladder broad ligament was opened and incised. Posterior broad ligament incised up to the uterosacral ligament and left uterine vessels skeletonized. Uterus was deviated to the left and  right IP was exposed, desiccated and incised followed by broad ligament and  right Round ligament. Anterior broad ligament was incised to create bladder flap, bladder was pushed away by blunt and sharp dissection with excellent hemostasis. Koh ring impression at cervicovaginal junction was seen well anteriorly. Right posterior broad ligament dissected, right Uterine vessels skeletonized. Right uterine vessels were desiccated and cut. Uterus was deviated to the right and the left uterine vessels were desiccated and cut. Vaginal occluder was inflated. Colpotomy was begun starting from midline anteriorly coming to the left and right and then circumferentially staying above the uterosacral ligaments posteriorly. Uterus, cervix, both tubes and ovaries were pulled out of the vaginal opening and uterus fundus was placed back in vagina to maintain pneumoperitoneum.  Vaginal cut edges were evaluated for hemostasis which was excellent. Irrigation was performed pedicles appeared dry. Robotic instruments switched for needle driver and long tip tissue forceps. CT 2 needle with 0 Vicryl was used for suturing vaginal cuff which was begun from 2 angles and then in interrupted figure-of-eight sutures on the cuff, closed without complications.   Irrigation was performed, all pedicles appeared to be hemostatic. Robotic instruments were removed. Robot was de-docked. Patient was made supine. Lap'scope was reintroduced, hemostasis was excellent. Liver surface appeared normal.  Robotic cannulas were removed under vision. Laparoscope and central port removed under vision. The stay sutures at the fascia tied together with excellent fascial closure. Skin approximated with subcuticular stitches on 4-0 Vicryl. Dermabond was applied. No vaginal bleeding noted on vaginal exam at the end. All instruments/lap/sponges counts were correct x2.  No complications. Patient tolerated procedure well and was reversed from anesthesia and brought to the  PACU stable condition. Foley catheter to be removed in PACU.  Dr Benjie Karvonen was the surgeon for entire case.

## 2014-09-11 NOTE — Anesthesia Procedure Notes (Signed)
Procedure Name: Intubation Date/Time: 09/11/2014 8:38 AM Performed by: Jonna Munro Pre-anesthesia Checklist: Patient identified, Emergency Drugs available, Suction available, Patient being monitored and Timeout performed Patient Re-evaluated:Patient Re-evaluated prior to inductionOxygen Delivery Method: Circle system utilized Preoxygenation: Pre-oxygenation with 100% oxygen Intubation Type: IV induction Ventilation: Mask ventilation without difficulty Laryngoscope Size: Mac and 3 Grade View: Grade I Tube type: Oral Tube size: 7.0 mm Number of attempts: 1 Airway Equipment and Method: Stylet Secured at: 21 cm Tube secured with: Tape Dental Injury: Teeth and Oropharynx as per pre-operative assessment

## 2014-09-11 NOTE — Anesthesia Postprocedure Evaluation (Signed)
  Anesthesia Post-op Note  Patient: Laurie Thornton  Procedure(s) Performed: Procedure(s) (LRB): ROBOTIC ASSISTED TOTAL HYSTERECTOMY (N/A) LAPAROSCOPIC BILATERAL SALPINGECTOMY (Bilateral) LAPAROSCOPIC LYSIS OF ADHESIONS (N/A)  Patient Location: PACU  Anesthesia Type: General  Level of Consciousness: awake and alert   Airway and Oxygen Therapy: Patient Spontanous Breathing  Post-op Pain: mild  Post-op Assessment: Post-op Vital signs reviewed, Patient's Cardiovascular Status Stable, Respiratory Function Stable, Patent Airway and No signs of Nausea or vomiting  Last Vitals:  Filed Vitals:   09/11/14 0709  BP: 124/86  Pulse: 71  Temp: 36.7 C  Resp: 18    Post-op Vital Signs: stable   Complications: No apparent anesthesia complications

## 2014-09-12 DIAGNOSIS — D251 Intramural leiomyoma of uterus: Secondary | ICD-10-CM | POA: Diagnosis not present

## 2014-09-12 LAB — CBC
HCT: 33.1 % — ABNORMAL LOW (ref 36.0–46.0)
Hemoglobin: 10.7 g/dL — ABNORMAL LOW (ref 12.0–15.0)
MCH: 30.8 pg (ref 26.0–34.0)
MCHC: 32.3 g/dL (ref 30.0–36.0)
MCV: 95.4 fL (ref 78.0–100.0)
Platelets: 242 10*3/uL (ref 150–400)
RBC: 3.47 MIL/uL — AB (ref 3.87–5.11)
RDW: 12.9 % (ref 11.5–15.5)
WBC: 7.5 10*3/uL (ref 4.0–10.5)

## 2014-09-12 LAB — BASIC METABOLIC PANEL
Anion gap: 1 — ABNORMAL LOW (ref 5–15)
BUN: 12 mg/dL (ref 6–20)
CO2: 28 mmol/L (ref 22–32)
Calcium: 8.4 mg/dL — ABNORMAL LOW (ref 8.9–10.3)
Chloride: 106 mmol/L (ref 101–111)
Creatinine, Ser: 0.91 mg/dL (ref 0.44–1.00)
Glucose, Bld: 114 mg/dL — ABNORMAL HIGH (ref 65–99)
POTASSIUM: 3.8 mmol/L (ref 3.5–5.1)
Sodium: 135 mmol/L (ref 135–145)

## 2014-09-12 MED ORDER — OXYCODONE-ACETAMINOPHEN 5-325 MG PO TABS: 1.0000 | ORAL_TABLET | ORAL | Status: DC | PRN

## 2014-09-12 MED ORDER — IBUPROFEN 600 MG PO TABS: 600.0000 mg | ORAL_TABLET | Freq: Three times a day (TID) | ORAL | Status: DC | PRN

## 2014-09-12 NOTE — Progress Notes (Signed)

## 2014-09-12 NOTE — Anesthesia Postprocedure Evaluation (Signed)
  Anesthesia Post-op Note  Patient: Laurie Thornton  Procedure(s) Performed: Procedure(s): ROBOTIC ASSISTED TOTAL HYSTERECTOMY (N/A) LAPAROSCOPIC BILATERAL SALPINGECTOMY (Bilateral) LAPAROSCOPIC LYSIS OF ADHESIONS (N/A)  Patient Location: [facility]  Anesthesia Type:General  Level of Consciousness: awake, alert , oriented and patient cooperative  Airway and Oxygen Therapy: Patient Spontanous Breathing  Post-op Pain: none  Post-op Assessment: Post-op Vital signs reviewed, Patient's Cardiovascular Status Stable, Respiratory Function Stable, Patent Airway and No signs of Nausea or vomiting              Post-op Vital Signs: Reviewed and stable  Last Vitals:  Filed Vitals:   09/12/14 0536  BP: 102/63  Pulse: 99  Temp: 36.7 C  Resp: 18    Complications: No apparent anesthesia complications

## 2014-09-12 NOTE — Progress Notes (Signed)
1 Day Post-Op Procedure(s) (LRB): ROBOTIC ASSISTED TOTAL HYSTERECTOMY (N/A) LAPAROSCOPIC BILATERAL SALPINGECTOMY (Bilateral) LAPAROSCOPIC LYSIS OF ADHESIONS (N/A)  Subjective: Patient reports tolerating PO and no problems voiding.  Pain well controlled. Ambulated well   Objective: I have reviewed patient's vital signs, intake and output, medications and labs.  General: alert and cooperative Resp: clear to auscultation bilaterally Cardio: normal apical impulse GI: soft, non-tender; bowel sounds normal; no masses,  no organomegaly and incision: dry Extremities: extremities normal, atraumatic, no cyanosis or edema and Homans sign is negative, no sign of DVT Vaginal Bleeding: none reported  Assessment: s/p Procedure(s): ROBOTIC ASSISTED TOTAL HYSTERECTOMY (N/A) LAPAROSCOPIC BILATERAL SALPINGECTOMY (Bilateral) LAPAROSCOPIC LYSIS OF ADHESIONS (N/A): stable, progressing well, tolerating diet and doing well Surgical findings reviewed  Plan: Discharge home   Post-op care ans warning signs reviewed/   Buell Parcel R 09/12/2014, 11:03 AM

## 2014-09-12 NOTE — Discharge Instructions (Signed)
Total Laparoscopic Hysterectomy, Care After °Refer to this sheet in the next few weeks. These instructions provide you with information on caring for yourself after your procedure. Your health care provider may also give you more specific instructions. Your treatment has been planned according to current medical practices, but problems sometimes occur. Call your health care provider if you have any problems or questions after your procedure. °WHAT TO EXPECT AFTER THE PROCEDURE °· Pain and bruising at the incision sites. You will be given pain medicine to control it. °· Menopausal symptoms such as hot flashes, night sweats, and insomnia if your ovaries were removed. °· Sore throat from the breathing tube that was inserted during surgery. °HOME CARE INSTRUCTIONS °· Only take over-the-counter or prescription medicines for pain, discomfort, or fever as directed by your health care provider.   °· Do not take aspirin. It can cause bleeding.   °· Do not drive when taking pain medicine.   °· Follow your health care provider's advice regarding diet, exercise, lifting, driving, and general activities.   °· Resume your usual diet as directed and allowed.   °· Get plenty of rest and sleep.   °· Do not douche, use tampons, or have sexual intercourse for at least 6 weeks, or until your health care provider gives you permission.   °· Change your bandages (dressings) as directed by your health care provider.   °· Monitor your temperature and notify your health care provider of a fever.   °· Take showers instead of baths for 2-3 weeks.   °· Do not drink alcohol until your health care provider gives you permission.   °· If you develop constipation, you may take a mild laxative with your health care provider's permission. Bran foods may help with constipation problems. Drinking enough fluids to keep your urine clear or pale yellow may help as well.   °· Try to have someone home with you for 1-2 weeks to help around the house.    °· Keep all of your follow-up appointments as directed by your health care provider.   °SEEK MEDICAL CARE IF: °· You have swelling, redness, or increasing pain around your incision sites.   °· You have pus coming from your incision.   °· You notice a bad smell coming from your incision.   °· Your incision breaks open.   °· You feel dizzy or lightheaded.   °· You have pain or bleeding when you urinate.   °· You have persistent diarrhea.   °· You have persistent nausea and vomiting.   °· You have abnormal vaginal discharge.   °· You have a rash.   °· You have any type of abnormal reaction or develop an allergy to your medicine.   °· You have poor pain control with your prescribed medicine.   °SEEK IMMEDIATE MEDICAL CARE IF: °· You have chest pain or shortness of breath. °· You have severe abdominal pain that is not relieved with pain medicine. °· You have pain or swelling in your legs. °MAKE SURE YOU: °· Understand these instructions. °· Will watch your condition. °· Will get help right away if you are not doing well or get worse. °Document Released: 11/27/2012 Document Revised: 02/11/2013 Document Reviewed: 11/27/2012 °[employer]® Patient Information ©2015 [employer]. This information is not intended to replace advice given to you by your health care provider. Make sure you discuss any questions you have with your health care provider. ° °

## 2014-09-12 NOTE — Discharge Summary (Addendum)
Post-operative Gyn Discharge Summary   Patient Name: Laurie Thornton OFBPZWCHEN DOB: 08-29-1975 MRN: [id number]  Date of admission: 09/11/2014  Date of discharge: 09/12/2014  Admitting diagnosis: Menometrorrhagia    Secondary diagnosis: None     Discharge diagnosis: S/p DaVinci robot assisted total laparoscopic hysterectomy, bilateral salpingectomg and lysis of adhesions on 09/11/2014   Hospital course:  Uncomplicated surgery and post-operative recovery Physical exam  Filed Vitals:   09/11/14 2137 09/12/14 0124 09/12/14 0536 09/12/14 1024  BP: 86/53 112/67 102/63 112/63  Pulse: 66 58 99 74  Temp: 98.9 F (37.2 C) 98.5 F (36.9 C) 98.1 F (36.7 C) 98.5 F (36.9 C)  TempSrc: Oral Oral Oral Oral  Resp: 16 18 18 18   Height:      Weight:      SpO2: 100% 100% 100% 99%   Physical exam:  A&O x 3, no acute distress. Pleasant HEENT neg Lungs CTA bilat CV RRR, S1S2 normal Abdo soft, non tender, non acute, incisions clean, intact. Normal active bowel sounds Extr no edema/ tenderness, Homann's negative bilateral  Pelvic no reported bleeding  Labs: Lab Results  Component Value Date   WBC 7.5 09/12/2014   HGB 10.7* 09/12/2014   HCT 33.1* 09/12/2014   MCV 95.4 09/12/2014   PLT 242 09/12/2014   CMP Latest Ref Rng 09/12/2014  Glucose 65 - 99 mg/dL 114(H)  BUN 6 - 20 mg/dL 12  Creatinine 0.44 - 1.00 mg/dL 0.91  Sodium 135 - 145 mmol/L 135  Potassium 3.5 - 5.1 mmol/L 3.8  Chloride 101 - 111 mmol/L 106  CO2 22 - 32 mmol/L 28  Calcium 8.9 - 10.3 mg/dL 8.4(L)  Total Protein 6.5 - 8.1 g/dL -  Total Bilirubin 0.3 - 1.2 mg/dL -  Alkaline Phos 38 - 126 U/L -  AST 15 - 41 U/L -  ALT 14 - 54 U/L -    Medications:   Medication List    TAKE these medications        clonazePAM 0.5 MG tablet  Commonly known as:  KLONOPIN  Take 0.5 mg by mouth daily as needed for anxiety.     ibuprofen 600 MG tablet  Commonly known as:  ADVIL,MOTRIN  Take 1 tablet (600 mg total) by mouth every 8  (eight) hours as needed.     lamoTRIgine 150 MG tablet  Commonly known as:  LAMICTAL  Take 300 mg by mouth daily.     oxyCODONE-acetaminophen 5-325 MG per tablet  Commonly known as:  PERCOCET/ROXICET  Take 1-2 tablets by mouth every 4 (four) hours as needed for severe pain (moderate to severe pain (when tolerating fluids)).     sertraline 25 MG tablet  Commonly known as:  ZOLOFT  Take 25 mg by mouth daily.        Diet: routine diet  Activity: Advance as tolerated. Pelvic rest for 6 weeks.   Outpatient follow up:2 weeks  09/12/2014 Elveria Royals, MD

## 2014-09-13 NOTE — Op Note (Addendum)
This is the note to be reviewed for surgery. Disregard all there Op notes.   Preoperative diagnosis:  Menometrorrhagia, prior endometrial ablation  Postop diagnosis: Same, bladder adhesions, increased pelvic vascularity  Procedure: da Vinci robot assisted total laparoscopic hysterectomy and bilateral salpingectomy Anesthesia Gen. Endotracheal Surgeon: Dr. Azucena Fallen Assistant: Artelia Laroche, CNM IV fluids: 1200 cc LR EBL: 50 cc Urine output: 086 cc, clear Complications: none Pathology: Uterus with cervix and both Fallopian tubes Disposition: PACU, stable Findings: Enlarged bulky 10 wk uterus with increased vascularity. Intrauterine balloon noted perforating from right uterine wall just above the uterine vessel. Normal ovaries. Tubes normal with evidence of tubal ligation. Anterior wall in lower abdomen adhesions to bladder and dense bladder adhesions to cervicovaginal area from prior c/section x 2. Normal bowel loops, liver surface.   Procedure:  Indication: --  39 year old female G3 P2, C/section x 2 and tubal ligation. She underwent endometrial ablation few years back but now has menometrorrhagia and pelvic pain and desires hysterectomy.   Complications of surgery including infection, bleeding, damage to internal organs and other surgery related problems including pneumonia, VTE reviewed and informed written consent was obtained. Also reviewed risks from surgical menopause.  She understood and gave informed written consent.  Patient was brought to the operating room with IV running. SCDs were on. She received Gentamicin and Clindamycin pre-operatively. Underwent general anesthesia without difficulty and was given dorsal lithotomy position, prepped and draped in sterile fashion. 3-way Foley catheter was placed. Cervix was exposed with a speculum and anterior lip of the cervix was grasped with tenaculum. Uterus was sounded to 10 cm. A  # 8 Rumi tip and a medium Koh ring was assembled on the  [employer] and entered in the uterine cavity and balloon was inflated to secure it in place. Koh ring was palpated again cervico-vaginal junction. Speculum was removed, tenaculum was left on the cervix.   Attention was focused on abdomen. Supraumbilical 12 mm vertical incision made with scalpel after injecting Marcaine, fascia dissected, grasped with Kocher's and incised, posterior rectus sheath and peritoneum grasped, incised, intraabdominal entry confirmed. Purse string stay stitch on 0-Vicryl taken on fascia and Hassan cannula introduced and Vicryl sutures secured on the Hassan cannula. Pneumoperitoneum was begun. Laparoscope was introduced and the peritoneal cavity was evaluated. Trendelenburg position given. Thick band of adhesion noted from anterior abdominal wall to the anterior mid to lower uterine fundus. Bladder adhesions noted at prior C/section scar. Normal ovaries notes. No other abnormal findings noted on gross exam.  Port sited marked and injected with Marcaine. Two Robotic cannulas inserted on right side and one on the left side and one assistant port also on the left side under vision. Robot was docked from the right side. PK, Scissors and Prograsp introduced through Robotic arms.  Dr. Benjie Karvonen scrubbed out and went for surgical console.  Uterus, ovaries, tubes and ureters evaluated. Anterior abdominal wall to uterus adhesion was dissected from the uterus from fundus down to isthmic area and stopped at bladder peritoneum since bladder was densely adherent to the c-section scar. Bladder was filled retrograde via 3Way foley with saline and bladder adhesion dissected carefully without complications.  Uterine manipulator balloon was noted bulging under right broad ligament perforating through soft post-ablation uterine body, laterally and just below uterine vessels. Some bleeding was noted when the balloon was deflated to try to move it back to the uterine cavity to help with manipulation. So,  right round ligament was grasped, desiccated and anterior broad ligament  was incised. Right uterine vessel was desiccated and bleeding was controlled. Bilateral salpingectomy was performed and tubes given off for specimen. Uterus was deviated to the left and right round ligament was desiccated and cut. Left utero-ovarian ligament was desiccated and cut and anterior and posterior broad ligaments incised. Uterine vessels skeletonized and desiccated. I scrubbed back in to adjust uterine manipulator but it would keep getting pushed through right lateral defect so the tip was kept in the uterine cavity with minimal tip balloon inflation and 2 tenaculums were placed on the cervix to keep the manipulator stable. I scrubbed out, went back to the console. Right uterine vessels were desiccated better since manipulator balloon was out of the way. Both uterine vessels that were desiccated were cut. Bleeding was well controlled.  Vaginal occluder inflated. Colpotomy performed circumferentially. Uterus, cervix were pulled out of the vaginal opening . Occluder was placed back in vagina to maintain pneumoperitoneum.  Vaginal cut edges were evaluated for hemostasis which was excellent. Irrigation was performed pedicles appeared dry. Robotic instruments switched for needle driver and long tip tissue forceps. 0-Vee-Lock sutures placed to close Colpotomy from right to left angle and back to the right. Needle was removed. Hemostasis was noted. Irrigation was performed, all pedicles appeared to be hemostatic. Robotic instruments were removed. Robot was de-docked.  Patient was made supine. Lap'scope was reintroduced, hemostasis was excellent. Liver surface appeared normal. 120 cc dilute Ropivacaine was instilled in the peritoneal cavity.  Robotic cannulas were removed under vision. Laparoscope and central port removed under vision after deflating pneumoperitoneum. The stay sutures at the fascia tied together with excellent fascial  closure. Skin approximated with subcuticular stitches on 4-0 Vicryl. Dermabond was applied. No vaginal bleeding noted on vaginal exam at the end. All instruments/lap/sponges counts were correct x2.  No complications. Patient tolerated procedure well and was reversed from anesthesia and brought to the PACU stable condition.   Dr Benjie Karvonen was the surgeon for entire case.

## 2014-09-14 ENCOUNTER — Encounter (HOSPITAL_COMMUNITY): Payer: Self-pay | Admitting: Obstetrics & Gynecology

## 2014-09-21 ENCOUNTER — Other Ambulatory Visit (HOSPITAL_COMMUNITY): Payer: BLUE CROSS/BLUE SHIELD

## 2014-10-11 ENCOUNTER — Inpatient Hospital Stay (HOSPITAL_COMMUNITY)
Admission: AD | Admit: 2014-10-11 | Discharge: 2014-10-12 | Disposition: A | Discharge status: Home / Self Care | Payer: BLUE CROSS/BLUE SHIELD | Source: Ambulatory Visit | Attending: Obstetrics and Gynecology | Admitting: Obstetrics and Gynecology

## 2014-10-11 ENCOUNTER — Inpatient Hospital Stay (HOSPITAL_COMMUNITY): Payer: BLUE CROSS/BLUE SHIELD

## 2014-10-11 ENCOUNTER — Encounter (HOSPITAL_COMMUNITY): Payer: Self-pay | Admitting: *Deleted

## 2014-10-11 DIAGNOSIS — F419 Anxiety disorder, unspecified: Secondary | ICD-10-CM | POA: Insufficient documentation

## 2014-10-11 DIAGNOSIS — Z90710 Acquired absence of both cervix and uterus: Secondary | ICD-10-CM | POA: Insufficient documentation

## 2014-10-11 DIAGNOSIS — G8929 Other chronic pain: Secondary | ICD-10-CM | POA: Insufficient documentation

## 2014-10-11 DIAGNOSIS — K5900 Constipation, unspecified: Secondary | ICD-10-CM | POA: Insufficient documentation

## 2014-10-11 DIAGNOSIS — IMO0002 Reserved for concepts with insufficient information to code with codable children: Secondary | ICD-10-CM

## 2014-10-11 DIAGNOSIS — F319 Bipolar disorder, unspecified: Secondary | ICD-10-CM | POA: Insufficient documentation

## 2014-10-11 DIAGNOSIS — R109 Unspecified abdominal pain: Secondary | ICD-10-CM | POA: Insufficient documentation

## 2014-10-11 DIAGNOSIS — N939 Abnormal uterine and vaginal bleeding, unspecified: Secondary | ICD-10-CM | POA: Insufficient documentation

## 2014-10-11 LAB — URINALYSIS, ROUTINE W REFLEX MICROSCOPIC
Bilirubin Urine: NEGATIVE
Glucose, UA: NEGATIVE mg/dL
Ketones, ur: NEGATIVE mg/dL
Leukocytes, UA: NEGATIVE
Nitrite: NEGATIVE
Protein, ur: NEGATIVE mg/dL
Specific Gravity, Urine: 1.02 (ref 1.005–1.030)
Urobilinogen, UA: 0.2 mg/dL (ref 0.0–1.0)
pH: 6 (ref 5.0–8.0)

## 2014-10-11 LAB — URINE MICROSCOPIC-ADD ON

## 2014-10-11 MED ORDER — POLYETHYLENE GLYCOL 3350 17 G PO PACK
17.0000 g | PACK | Freq: Every day | ORAL | Status: DC
  Administered 2014-10-11: 17 g via ORAL
  Filled 2014-10-11 (×2): qty 1

## 2014-10-11 MED ORDER — DOCUSATE SODIUM 100 MG PO CAPS
200.0000 mg | ORAL_CAPSULE | ORAL | Status: AC
  Administered 2014-10-11: 200 mg via ORAL
  Filled 2014-10-11: qty 2

## 2014-10-11 MED ORDER — MAGNESIUM OXIDE 400 (241.3 MG) MG PO TABS
400.0000 mg | ORAL_TABLET | Freq: Every day | ORAL | Status: DC
Start: 2014-10-11 — End: 2014-10-12
  Administered 2014-10-11: 400 mg via ORAL
  Filled 2014-10-11 (×2): qty 1

## 2014-10-11 NOTE — MAU Note (Signed)
Dr. Garwin Brothers on for practice, called and paged.

## 2014-10-11 NOTE — MAU Note (Signed)
Pt had a hysterectomy last month and states that after she drove to see her daughter at college, she began to have apin on the right side into the umbilicus and radiating down the right leg. Pt states it is intermittent with some spotting last night and today.

## 2014-10-11 NOTE — MAU Note (Signed)
Radiologist recommending CT scan of pelvis and abdomen. Emelda Fear CNM phone and message left.

## 2014-10-11 NOTE — MAU Provider Note (Signed)
History     CSN: [id number]  Arrival date and time: 10/11/14 1935 Provider here to see patient @ 2110    Chief Complaint  Patient presents with  . Abdominal Pain   HPI Intense cramping and bloating few days worse tonight 9/9 dark red bloody discharge since yesterday Urinary urgency and hesitancy in past 2 weeks Davinci hysterectomy 4 weeks ago - 2 week post-op done / 6 week scheduled 10-30-2014 sex this past week constipation with BM maybe once per week after 1-2 doses of laxative  BM Friday after 7 days without BM - none since driving and traveling a lot in past 2 weeks - daughter away at college and homesick (has been driving to visit) Pain around belly-button that radiates down lower abdomen Past Medical History  Diagnosis Date  . Chronic abdominal pain   . Depression   . Anxiety   . Bipolar disorder   . Anemia     Past Surgical History  Procedure Laterality Date  . Novasure ablation  2012  . Tubal ligation  2000  . Cesarean section  1997, 2000     x 2  . Knee surgery Right 1995, 1992    x 2  . Mass excision  2002    right sided abd wall mass excision  . Robotic assisted total hysterectomy N/A 09/11/2014    Procedure: ROBOTIC ASSISTED TOTAL HYSTERECTOMY;  Surgeon: Azucena Fallen, MD;  Location: [facility];  Service: Gynecology;  Laterality: N/A;  . Laparoscopic bilateral salpingectomy Bilateral 09/11/2014    Procedure: LAPAROSCOPIC BILATERAL SALPINGECTOMY;  Surgeon: Azucena Fallen, MD;  Location: [facility];  Service: Gynecology;  Laterality: Bilateral;  . Laparoscopic lysis of adhesions N/A 09/11/2014    Procedure: LAPAROSCOPIC LYSIS OF ADHESIONS;  Surgeon: Azucena Fallen, MD;  Location: [facility];  Service: Gynecology;  Laterality: N/A;  . Abdominal hysterectomy      History reviewed. No pertinent family history.  Social History  Substance Use Topics  . Smoking status: Never Smoker   . Smokeless tobacco: Never Used  . Alcohol Use: No    Allergies:  Allergies  Allergen  Reactions  . Amoxicillin Hives    Prescriptions prior to admission  Medication Sig Dispense Refill Last Dose  . clonazePAM (KLONOPIN) 0.5 MG tablet Take 0.5 mg by mouth daily as needed for anxiety.   10/11/2014 at Unknown time  . ibuprofen (ADVIL,MOTRIN) 600 MG tablet Take 1 tablet (600 mg total) by mouth every 8 (eight) hours as needed. 30 tablet 0 Past Week at Unknown time  . lamoTRIgine (LAMICTAL) 150 MG tablet Take 350 mg by mouth daily.    10/11/2014 at Unknown time  . oxyCODONE-acetaminophen (PERCOCET/ROXICET) 5-325 MG per tablet Take 1-2 tablets by mouth every 4 (four) hours as needed for severe pain (moderate to severe pain (when tolerating fluids)). 30 tablet 0 10/11/2014 at Unknown time  . senna (SENOKOT) 8.6 MG TABS tablet Take 1 tablet by mouth daily as needed for mild constipation.   10/11/2014 at Unknown time  . sertraline (ZOLOFT) 25 MG tablet Take 25 mg by mouth daily.   10/11/2014 at Unknown time  . traZODone (DESYREL) 50 MG tablet Take 50 mg by mouth at bedtime.   10/10/2014 at Unknown time    ROS  cramps abdominal bloating with constipation (BM 1 x per week after 1-2 doses of laxative) dark red bleeding  Physical Exam   Blood pressure 128/87, pulse 88, temperature 98.4 F (36.9 C), temperature source Oral, resp. rate 18, last menstrual period  09/02/2014.  Physical Exam Alert and oriented - NAD or pain Abdomen slightly bloated / mildly diffuse tenderness / port sites well-healed SE: scant dark red bloody discharge          vaginal cuff examined - no disruption in cuff / no active bleeding seen / no granulation tissue visualized Bimanual: mild suprapubic tenderness                   vaginal cuff intact and non-tender with exam   Results for Laurie, Thornton (MRN [id number]) as of 10/12/2014 00:09  Ref. Range 10/11/2014 21:45  Appearance Latest Ref Range: CLEAR  CLEAR  Bacteria, UA Latest Ref Range: RARE  RARE  Bilirubin Urine Latest Ref Range: NEGATIVE  NEGATIVE   Color, Urine Latest Ref Range: YELLOW  YELLOW  Glucose Latest Ref Range: NEGATIVE mg/dL NEGATIVE  Hgb urine dipstick Latest Ref Range: NEGATIVE  MODERATE (A)  Ketones, ur Latest Ref Range: NEGATIVE mg/dL NEGATIVE  Leukocytes, UA Latest Ref Range: NEGATIVE  NEGATIVE  Nitrite Latest Ref Range: NEGATIVE  NEGATIVE  pH Latest Ref Range: 5.0-8.0  6.0  Protein Latest Ref Range: NEGATIVE mg/dL NEGATIVE  RBC / HPF Latest Ref Range: <3 RBC/hpf 3-6  Specific Gravity, Urine Latest Ref Range: 1.005-1.030  1.020  Squamous Epithelial / LPF Latest Ref Range: RARE  RARE  Urobilinogen, UA Latest Ref Range: 0.0-1.0 mg/dL 0.2  WBC, UA Latest Ref Range: <3 WBC/hpf 0-2                    MAU Course  Procedures Pelvic sono  2158 and 2210 - TC from ultrasonographer: 1) gain clinical information (bleeding today - post sex and recent straining with constipation and NORMAL exam and previously normal post-op course)  2) return call after discussion with radiologist on-call who prefers CT with and without contrast to best assess for hematoma.  CT not done until 0200 - resulted as NORMAL CT  EXAM: CT ABDOMEN AND PELVIS WITHOUT CONTRAST  TECHNIQUE: Multidetector CT imaging of the abdomen and pelvis was performed following the standard protocol without IV contrast.  COMPARISON: 06/26/2013  FINDINGS: Lung bases are clear.  Unenhanced appearance of the liver, spleen, gallbladder, pancreas, adrenal glands, kidneys, abdominal aorta, inferior vena cava, and retroperitoneal lymph nodes is unremarkable. With stomach, small bowel, and colon are not abnormally distended. Contrast material flows through to the colon without evidence of bowel obstruction. No free air or free fluid in the abdomen.  Pelvis: Appendix is normal. No bladder wall is not thickened. Uterus is surgically absent. Small amount of free fluid in the pelvis is probably postoperative. No significant pelvic lymphadenopathy. No loculated  fluid collections. No destructive bone lesions.  IMPRESSION: Small amount of free fluid in the pelvis is likely postoperative. No acute process otherwise demonstrated in the abdomen or pelvis. Appendix is normal.   Electronically Signed  By: Lucienne Capers M.D.  On: 10/12/2014 02:15    Assessment and Plan  4 weeks LAVH (Davinci) Constipation and bloating Abdominal pain Vaginal bleeding  Artelia Laroche 10/11/2014, 9:08 PM    Update for 10/12/2014 at 0130 - still awaiting CT                                                   cramping persistent - Nubain 10mg  sub-q pain while awaiting CT  DC HOME - normal CT OOW tomorrow - note from [facility] at follow-up apt Call office tomorrow to arrange OV Dr Benjie Karvonen to re-assess bleeding and cramping  Rx for anaprox for cramping magneisum and colace to manage chronic constipation likely cause of bloating and pain  NO SEX or exercise until cleared by physician

## 2014-10-11 NOTE — MAU Note (Signed)
Message left for T [facility] CNM.

## 2014-10-12 ENCOUNTER — Encounter (HOSPITAL_COMMUNITY): Payer: Self-pay

## 2014-10-12 MED ORDER — NALBUPHINE HCL 10 MG/ML IJ SOLN
10.0000 mg | INTRAMUSCULAR | Status: AC
  Administered 2014-10-12: 10 mg via SUBCUTANEOUS
  Filled 2014-10-12: qty 1

## 2014-10-12 MED ORDER — MAGNESIUM OXIDE 400 (241.3 MG) MG PO TABS: 400.0000 mg | ORAL_TABLET | Freq: Every day | ORAL | Status: DC

## 2014-10-12 MED ORDER — DOCUSATE SODIUM 100 MG PO CAPS: 200.0000 mg | ORAL_CAPSULE | Freq: Every day | ORAL | Status: DC | PRN

## 2014-10-12 MED ORDER — NAPROXEN SODIUM 550 MG PO TABS: 550.0000 mg | ORAL_TABLET | Freq: Two times a day (BID) | ORAL | Status: DC

## 2014-10-12 NOTE — MAU Note (Signed)
Radiology report in and T Mel Almond CNM coming to [facility] with patient.

## 2015-04-07 ENCOUNTER — Emergency Department: Payer: Self-pay

## 2015-04-07 ENCOUNTER — Emergency Department
Admission: EM | Admit: 2015-04-07 | Discharge: 2015-04-07 | Disposition: A | Discharge status: Home / Self Care | Payer: Self-pay | Attending: Emergency Medicine | Admitting: Emergency Medicine

## 2015-04-07 DIAGNOSIS — R519 Headache, unspecified: Secondary | ICD-10-CM

## 2015-04-07 DIAGNOSIS — Z880 Allergy status to penicillin: Secondary | ICD-10-CM | POA: Insufficient documentation

## 2015-04-07 DIAGNOSIS — R51 Headache: Secondary | ICD-10-CM | POA: Insufficient documentation

## 2015-04-07 DIAGNOSIS — Z79899 Other long term (current) drug therapy: Secondary | ICD-10-CM | POA: Insufficient documentation

## 2015-04-07 DIAGNOSIS — Z791 Long term (current) use of non-steroidal anti-inflammatories (NSAID): Secondary | ICD-10-CM | POA: Insufficient documentation

## 2015-04-07 LAB — CBC
HEMATOCRIT: 37.7 % (ref 35.0–47.0)
Hemoglobin: 12.8 g/dL (ref 12.0–16.0)
MCH: 30.7 pg (ref 26.0–34.0)
MCHC: 34 g/dL (ref 32.0–36.0)
MCV: 90.5 fL (ref 80.0–100.0)
PLATELETS: 249 10*3/uL (ref 150–440)
RBC: 4.16 MIL/uL (ref 3.80–5.20)
RDW: 13.3 % (ref 11.5–14.5)
WBC: 6 10*3/uL (ref 3.6–11.0)

## 2015-04-07 LAB — COMPREHENSIVE METABOLIC PANEL
ALT: 15 U/L (ref 14–54)
AST: 23 U/L (ref 15–41)
Albumin: 4 g/dL (ref 3.5–5.0)
Alkaline Phosphatase: 45 U/L (ref 38–126)
Anion gap: 6 (ref 5–15)
BILIRUBIN TOTAL: 0.6 mg/dL (ref 0.3–1.2)
BUN: 11 mg/dL (ref 6–20)
CHLORIDE: 105 mmol/L (ref 101–111)
CO2: 29 mmol/L (ref 22–32)
CREATININE: 0.89 mg/dL (ref 0.44–1.00)
Calcium: 9.3 mg/dL (ref 8.9–10.3)
Glucose, Bld: 115 mg/dL — ABNORMAL HIGH (ref 65–99)
Potassium: 3.9 mmol/L (ref 3.5–5.1)
Sodium: 140 mmol/L (ref 135–145)
TOTAL PROTEIN: 7.6 g/dL (ref 6.5–8.1)

## 2015-04-07 LAB — RAPID INFLUENZA A&B ANTIGENS
Influenza A (ARMC): NEGATIVE
Influenza B (ARMC): NEGATIVE

## 2015-04-07 MED ORDER — SODIUM CHLORIDE 0.9 % IV BOLUS (SEPSIS)
1000.0000 mL | Freq: Once | INTRAVENOUS | Status: AC
  Administered 2015-04-07: 1000 mL via INTRAVENOUS

## 2015-04-07 MED ORDER — KETOROLAC TROMETHAMINE 30 MG/ML IJ SOLN
30.0000 mg | Freq: Once | INTRAMUSCULAR | Status: AC
  Administered 2015-04-07: 30 mg via INTRAVENOUS
  Filled 2015-04-07: qty 1

## 2015-04-07 MED ORDER — OXYCODONE-ACETAMINOPHEN 5-325 MG PO TABS: 1.0000 | ORAL_TABLET | ORAL | Status: DC | PRN

## 2015-04-07 NOTE — ED Notes (Signed)
Patient transported to CT 

## 2015-04-07 NOTE — ED Notes (Signed)
Pt in with co headache x 3 days no recent cold symptoms.  Has had chills and body aches states headache worse when she moves her head.

## 2015-04-07 NOTE — ED Provider Notes (Signed)
[facility] Emergency Department Provider Note  ____________________________________________  Time seen: 5:50 AM  I have reviewed the triage vital signs and the nursing notes.   HISTORY  Chief Complaint Headache      HPI Laurie Thornton is a 40 y.o. female presents with frontal headache 3 days accompanied by chills and generalized body aches. Patient denies any nausea vomiting or diarrhea. Patient denies any weakness or numbness. Patient denies any gait instability. Patient states she has a distant family history of cerebral aneurysms    Past Medical History  Diagnosis Date  . Chronic abdominal pain   . Depression   . Anxiety   . Bipolar disorder   . Anemia     Patient Active Problem List   Diagnosis Date Noted  . S/P laparoscopic hysterectomy 09/11/2014  . Bipolar disorder, current episode mixed, moderate ([place]) 09/03/2014    Past Surgical History  Procedure Laterality Date  . Novasure ablation  2012  . Tubal ligation  2000  . Cesarean section  1997, 2000     x 2  . Knee surgery Right 1995, 1992    x 2  . Mass excision  2002    right sided abd wall mass excision  . Robotic assisted total hysterectomy N/A 09/11/2014    Procedure: ROBOTIC ASSISTED TOTAL HYSTERECTOMY;  Surgeon: Azucena Fallen, MD;  Location: [place] [facility];  Service: Gynecology;  Laterality: N/A;  . Laparoscopic bilateral salpingectomy Bilateral 09/11/2014    Procedure: LAPAROSCOPIC BILATERAL SALPINGECTOMY;  Surgeon: Azucena Fallen, MD;  Location: [place] [facility];  Service: Gynecology;  Laterality: Bilateral;  . Laparoscopic lysis of adhesions N/A 09/11/2014    Procedure: LAPAROSCOPIC LYSIS OF ADHESIONS;  Surgeon: Azucena Fallen, MD;  Location: [place] [facility];  Service: Gynecology;  Laterality: N/A;  . Abdominal hysterectomy      Current Outpatient Rx  Name  Route  Sig  Dispense  Refill  . clonazePAM (KLONOPIN) 0.5 MG tablet   Oral   Take 0.5 mg by mouth daily as needed for anxiety.         .  docusate sodium (COLACE) 100 MG capsule   Oral   Take 2 capsules (200 mg total) by mouth daily as needed (hard bowel movment).   30 capsule   2   . ibuprofen (ADVIL,MOTRIN) 600 MG tablet   Oral   Take 1 tablet (600 mg total) by mouth every 8 (eight) hours as needed.   30 tablet   0   . lamoTRIgine (LAMICTAL) 150 MG tablet   Oral   Take 350 mg by mouth daily.          . magnesium oxide (MAG-OX) 400 (241.3 MG) MG tablet   Oral   Take 1 tablet (400 mg total) by mouth daily.   90 tablet   4   . naproxen sodium (ANAPROX DS) 550 MG tablet   Oral   Take 1 tablet (550 mg total) by mouth 2 (two) times daily with a meal.   60 tablet   0   . oxyCODONE-acetaminophen (PERCOCET/ROXICET) 5-325 MG per tablet   Oral   Take 1-2 tablets by mouth every 4 (four) hours as needed for severe pain (moderate to severe pain (when tolerating fluids)).   30 tablet   0   . senna (SENOKOT) 8.6 MG TABS tablet   Oral   Take 1 tablet by mouth daily as needed for mild constipation.         . sertraline (ZOLOFT) 25 MG tablet  Oral   Take 25 mg by mouth daily.         . traZODone (DESYREL) 50 MG tablet   Oral   Take 50 mg by mouth at bedtime.           Allergies Amoxicillin  No family history on file.  Social History Social History  Substance Use Topics  . Smoking status: Never Smoker   . Smokeless tobacco: Never Used  . Alcohol Use: No    Review of Systems  Constitutional: Negative for fever. Eyes: Negative for visual changes. ENT: Negative for sore throat. Cardiovascular: Negative for chest pain. Respiratory: Negative for shortness of breath. Gastrointestinal: Negative for abdominal pain, vomiting and diarrhea. Genitourinary: Negative for dysuria. Musculoskeletal: Negative for back pain. Skin: Negative for rash. Neurological: Positive for headaches   10-point ROS otherwise negative.  ____________________________________________   PHYSICAL EXAM:  VITAL  SIGNS: ED Triage Vitals  Enc Vitals Group     BP 04/07/15 0353 113/73 mmHg     Pulse Rate 04/07/15 0353 77     Resp 04/07/15 0352 18     Temp 04/07/15 0352 96.3 F (35.7 C)     Temp Source 04/07/15 0352 Oral     SpO2 04/07/15 0353 97 %     Weight 04/07/15 0352 200 lb (90.719 kg)     Height 04/07/15 0352 5\' 6"  (1.676 m)     Head Cir --      Peak Flow --      Pain Score 04/07/15 0353 10     Pain Loc --      Pain Edu? --      Excl. in [place]? --      Constitutional: Alert and oriented. Well appearing and in no distress. Eyes: Conjunctivae are normal. PERRL. Normal extraocular movements. ENT   Head: Normocephalic and atraumatic.   Nose: No congestion/rhinnorhea.   Mouth/Throat: Mucous membranes are moist.   Neck: No stridor. Hematological/Lymphatic/Immunilogical: No cervical lymphadenopathy. Cardiovascular: Normal rate, regular rhythm. Normal and symmetric distal pulses are present in all extremities. No murmurs, rubs, or gallops. Respiratory: Normal respiratory effort without tachypnea nor retractions. Breath sounds are clear and equal bilaterally. No wheezes/rales/rhonchi. Gastrointestinal: Soft and nontender. No distention. There is no CVA tenderness. Genitourinary: deferred Musculoskeletal: Nontender with normal range of motion in all extremities. No joint effusions.  No lower extremity tenderness nor edema. Neurologic:  Normal speech and language. No gross focal neurologic deficits are appreciated. Speech is normal.  Skin:  Skin is warm, dry and intact. No rash noted. Psychiatric: Mood and affect are normal. Speech and behavior are normal. Patient exhibits appropriate insight and judgment.  ____________________________________________    LABS (pertinent positives/negatives)  Labs Reviewed  COMPREHENSIVE METABOLIC PANEL - Abnormal; Notable for the following:    Glucose, Bld 115 (*)    All other components within normal limits  RAPID INFLUENZA A&B ANTIGENS ([facility]  ONLY)  CBC  URINALYSIS COMPLETEWITH MICROSCOPIC ([facility] ONLY)       RADIOLOGY CT Head Wo Contrast (Final result) Result time: 04/07/15 07:11:57   Final result by Rad Results In Interface (04/07/15 07:11:57)   Narrative:   CLINICAL DATA: Three-day history of headache  EXAM: CT HEAD WITHOUT CONTRAST  TECHNIQUE: Contiguous axial images were obtained from the base of the skull through the vertex without intravenous contrast.  COMPARISON: None.  FINDINGS: The ventricles are normal in size and configuration. There is a 6 x 5 mm focus of decreased attenuation in the posterior superior right temporal lobe  lateral to the quadrigeminal plate cistern, felt to represent a small arachnoid cyst. There is no other evidence of mass. There is no hemorrhage, extra-axial fluid collection, or midline shift. Gray-white compartments are normal. No acute infarct evident. The bony calvarium appears intact. The mastoid air cells are clear.  IMPRESSION: Small arachnoid cyst in the posterior superior right temporal lobe, a benign finding. No evidence of hemorrhage or edema. No evidence suggesting acute infarct.   Electronically Signed By: Lowella Grip III M.D. On: 04/07/2015 07:11         INITIAL IMPRESSION / ASSESSMENT AND PLAN / ED COURSE  Pertinent labs & imaging results that were available during my care of the patient were reviewed by me and considered in my medical decision making (see chart for details).  Patient received IV Toradol 1 L normal saline in emergency department improvement of headache. CT scan of the abdomen revealed no gross abnormalities incidental finding of a small arachnoid cyst noted  ____________________________________________   FINAL CLINICAL IMPRESSION(S) / ED DIAGNOSES  Final diagnoses:  Acute nonintractable headache, unspecified headache type      Gregor Hams, MD 04/07/15 2244

## 2015-04-07 NOTE — Discharge Instructions (Signed)

## 2015-06-13 ENCOUNTER — Emergency Department
Admission: EM | Admit: 2015-06-13 | Discharge: 2015-06-13 | Disposition: A | Discharge status: Home / Self Care | Payer: BLUE CROSS/BLUE SHIELD | Attending: Emergency Medicine | Admitting: Emergency Medicine

## 2015-06-13 ENCOUNTER — Encounter: Payer: Self-pay | Admitting: *Deleted

## 2015-06-13 DIAGNOSIS — F3162 Bipolar disorder, current episode mixed, moderate: Secondary | ICD-10-CM | POA: Insufficient documentation

## 2015-06-13 DIAGNOSIS — N390 Urinary tract infection, site not specified: Secondary | ICD-10-CM | POA: Insufficient documentation

## 2015-06-13 DIAGNOSIS — Z79899 Other long term (current) drug therapy: Secondary | ICD-10-CM | POA: Insufficient documentation

## 2015-06-13 LAB — URINALYSIS COMPLETE WITH MICROSCOPIC (ARMC ONLY)
Bilirubin Urine: NEGATIVE
Glucose, UA: NEGATIVE mg/dL
Ketones, ur: NEGATIVE mg/dL
Nitrite: NEGATIVE
PH: 6 (ref 5.0–8.0)
PROTEIN: 30 mg/dL — AB
Specific Gravity, Urine: 1.019 (ref 1.005–1.030)

## 2015-06-13 MED ORDER — ONDANSETRON 4 MG PO TBDP
4.0000 mg | ORAL_TABLET | Freq: Once | ORAL | Status: AC
  Administered 2015-06-13: 4 mg via ORAL
  Filled 2015-06-13: qty 1

## 2015-06-13 MED ORDER — CIPROFLOXACIN HCL 500 MG PO TABS: 500.0000 mg | ORAL_TABLET | Freq: Two times a day (BID) | ORAL | Status: DC

## 2015-06-13 MED ORDER — ONDANSETRON 4 MG PO TBDP
4.0000 mg | ORAL_TABLET | Freq: Three times a day (TID) | ORAL | Status: DC | PRN
Start: 2015-06-13 — End: 2017-05-10

## 2015-06-13 NOTE — ED Notes (Signed)
NAD noted at time of D/C. Pt denies questions or concerns. Pt ambulatory to the lobby at this time.  

## 2015-06-13 NOTE — Discharge Instructions (Signed)
Increase fluids. Begin antibiotic tonight and then twice a day until completely gone. Zofran is for nausea if needed. Make an appointment to have your urine rechecked at your primary care doctor in 12-14 days.

## 2015-06-13 NOTE — ED Provider Notes (Signed)
[facility] Emergency Department Provider Note  ____________________________________________  Time seen: Approximately 6:09 PM  I have reviewed the triage vital signs and the nursing notes.   HISTORY  Chief Complaint Urinary Frequency and Dysuria   HPI Laurie Thornton is a 40 y.o. female is here with complaint of urinary frequency that began yesterday. Patient also has some nausea but no vomiting. She is unaware of any fever or chills. Patient has had a history of urinary tract infections in the past. The last one being one year ago prior to her hysterectomy. Yesterday she began taking cranberry tablets hoping to avoid any continued symptoms. She rates her pain as a 9 out of 10.   Past Medical History  Diagnosis Date  . Chronic abdominal pain   . Depression   . Anxiety   . Bipolar disorder ([place])   . Anemia     Patient Active Problem List   Diagnosis Date Noted  . S/P laparoscopic hysterectomy 09/11/2014  . Bipolar disorder, current episode mixed, moderate ([place]) 09/03/2014    Past Surgical History  Procedure Laterality Date  . Novasure ablation  2012  . Tubal ligation  2000  . Cesarean section  1997, 2000     x 2  . Knee surgery Right 1995, 1992    x 2  . Mass excision  2002    right sided abd wall mass excision  . Robotic assisted total hysterectomy N/A 09/11/2014    Procedure: ROBOTIC ASSISTED TOTAL HYSTERECTOMY;  Surgeon: Azucena Fallen, MD;  Location: [place] [facility];  Service: Gynecology;  Laterality: N/A;  . Laparoscopic bilateral salpingectomy Bilateral 09/11/2014    Procedure: LAPAROSCOPIC BILATERAL SALPINGECTOMY;  Surgeon: Azucena Fallen, MD;  Location: [place] [facility];  Service: Gynecology;  Laterality: Bilateral;  . Laparoscopic lysis of adhesions N/A 09/11/2014    Procedure: LAPAROSCOPIC LYSIS OF ADHESIONS;  Surgeon: Azucena Fallen, MD;  Location: [place] [facility];  Service: Gynecology;  Laterality: N/A;  . Abdominal hysterectomy      Current Outpatient Rx   Name  Route  Sig  Dispense  Refill  . ciprofloxacin (CIPRO) 500 MG tablet   Oral   Take 1 tablet (500 mg total) by mouth 2 (two) times daily.   20 tablet   0   . clonazePAM (KLONOPIN) 0.5 MG tablet   Oral   Take 0.5 mg by mouth daily as needed for anxiety.         . docusate sodium (COLACE) 100 MG capsule   Oral   Take 2 capsules (200 mg total) by mouth daily as needed (hard bowel movment).   30 capsule   2   . lamoTRIgine (LAMICTAL) 150 MG tablet   Oral   Take 350 mg by mouth daily.          . magnesium oxide (MAG-OX) 400 (241.3 MG) MG tablet   Oral   Take 1 tablet (400 mg total) by mouth daily.   90 tablet   4   . ondansetron (ZOFRAN ODT) 4 MG disintegrating tablet   Oral   Take 1 tablet (4 mg total) by mouth every 8 (eight) hours as needed for nausea or vomiting.   20 tablet   0   . senna (SENOKOT) 8.6 MG TABS tablet   Oral   Take 1 tablet by mouth daily as needed for mild constipation.         . sertraline (ZOLOFT) 25 MG tablet   Oral   Take 25 mg by mouth daily.         Marland Kitchen  traZODone (DESYREL) 50 MG tablet   Oral   Take 50 mg by mouth at bedtime.           Allergies Amoxicillin  No family history on file.  Social History Social History  Substance Use Topics  . Smoking status: Never Smoker   . Smokeless tobacco: Never Used  . Alcohol Use: No    Review of Systems Constitutional: No fever/chills Eyes: No visual changes. ENT: No sore throat. Cardiovascular: Denies chest pain. Respiratory: Denies shortness of breath. Gastrointestinal: No abdominal pain.  Positive nausea, no vomiting.   Genitourinary: Positive for dysuria. Musculoskeletal: Negative for back pain. Skin: Negative for rash. Neurological: Negative for headaches, focal weakness or numbness.  10-point ROS otherwise negative.  ____________________________________________   PHYSICAL EXAM:  VITAL SIGNS: ED Triage Vitals  Enc Vitals Group     BP 06/13/15 1754 125/79  mmHg     Pulse Rate 06/13/15 1754 65     Resp 06/13/15 1754 20     Temp 06/13/15 1754 98.1 F (36.7 C)     Temp Source 06/13/15 1754 Oral     SpO2 06/13/15 1754 99 %     Weight 06/13/15 1754 200 lb (90.719 kg)     Height 06/13/15 1754 5\' 6"  (1.676 m)     Head Cir --      Peak Flow --      Pain Score 06/13/15 1755 9     Pain Loc --      Pain Edu? --      Excl. in [place]? --     Constitutional: Alert and oriented. Well appearing and in no acute distress. Eyes: Conjunctivae are normal. PERRL. EOMI. Head: Atraumatic. Nose: No congestion/rhinnorhea. Neck: No stridor.   Cardiovascular: Normal rate, regular rhythm. Grossly normal heart sounds.  Good peripheral circulation. Respiratory: Normal respiratory effort.  No retractions. Lungs CTAB. Gastrointestinal: Soft and nontender. No distention. Minimal right CVA tenderness. Musculoskeletal: No lower extremity tenderness nor edema.  No joint effusions. Neurologic:  Normal speech and language. No gross focal neurologic deficits are appreciated. No gait instability. Skin:  Skin is warm, dry and intact. No rash noted. Psychiatric: Mood and affect are normal. Speech and behavior are normal.  ____________________________________________   LABS (all labs ordered are listed, but only abnormal results are displayed)  Labs Reviewed  URINALYSIS COMPLETEWITH MICROSCOPIC ([place] ONLY) - Abnormal; Notable for the following:    Color, Urine YELLOW (*)    APPearance CLOUDY (*)    Hgb urine dipstick 2+ (*)    Protein, ur 30 (*)    Leukocytes, UA 3+ (*)    Bacteria, UA MANY (*)    Squamous Epithelial / LPF 6-30 (*)    All other components within normal limits  URINE CULTURE     PROCEDURES  Procedure(s) performed: None  Critical Care performed: No  ____________________________________________   INITIAL IMPRESSION / ASSESSMENT AND PLAN / ED COURSE  Pertinent labs & imaging results that were available during my care of the patient were  reviewed by me and considered in my medical decision making (see chart for details).  Patient was placed on Cipro as she has been on it in the past without any problems. She states that all the sulfa drugs have "quit working". Patient will follow-up with her primary care doctor for a recheck of her urine in approximately 10-12 days. Urine culture was done. Patient was given a prescription also for Zofran if needed. ____________________________________________   FINAL CLINICAL IMPRESSION(S) / ED  DIAGNOSES  Final diagnoses:  Acute urinary tract infection      Johnn Hai, PA-C 06/13/15 [place], MD 06/14/15 [phone]

## 2015-06-13 NOTE — ED Notes (Signed)
Pt reports urinary frequency and dysuria yesterday.  Pt has nausea.  Pt alert

## 2015-06-16 LAB — URINE CULTURE: Special Requests: NORMAL

## 2016-02-02 ENCOUNTER — Emergency Department
Admission: EM | Admit: 2016-02-02 | Discharge: 2016-02-02 | Disposition: A | Discharge status: Home / Self Care | Payer: Managed Care, Other (non HMO) | Attending: Emergency Medicine | Admitting: Emergency Medicine

## 2016-02-02 ENCOUNTER — Encounter: Payer: Self-pay | Admitting: *Deleted

## 2016-02-02 DIAGNOSIS — Z79899 Other long term (current) drug therapy: Secondary | ICD-10-CM | POA: Insufficient documentation

## 2016-02-02 DIAGNOSIS — R300 Dysuria: Secondary | ICD-10-CM | POA: Diagnosis present

## 2016-02-02 DIAGNOSIS — N3001 Acute cystitis with hematuria: Secondary | ICD-10-CM | POA: Diagnosis not present

## 2016-02-02 LAB — URINALYSIS, COMPLETE (UACMP) WITH MICROSCOPIC
BACTERIA UA: NONE SEEN
Bilirubin Urine: NEGATIVE
GLUCOSE, UA: NEGATIVE mg/dL
KETONES UR: NEGATIVE mg/dL
Nitrite: NEGATIVE
PH: 6 (ref 5.0–8.0)
Protein, ur: NEGATIVE mg/dL
SPECIFIC GRAVITY, URINE: 1.002 — AB (ref 1.005–1.030)

## 2016-02-02 MED ORDER — SULFAMETHOXAZOLE-TRIMETHOPRIM 800-160 MG PO TABS: 1.0000 | ORAL_TABLET | Freq: Two times a day (BID) | ORAL | 0 refills | Status: DC

## 2016-02-02 MED ORDER — PHENAZOPYRIDINE HCL 200 MG PO TABS: 200.0000 mg | ORAL_TABLET | Freq: Three times a day (TID) | ORAL | 0 refills | Status: DC | PRN

## 2016-02-02 NOTE — Discharge Instructions (Signed)
Follow up with [facility] for symptoms that are not improving over the next few days. Return to the ER for symptoms that change or worsen if unable to schedule an appointment.

## 2016-02-02 NOTE — ED Triage Notes (Signed)
Pt report urinary frequency and pain for 3 days.  No n/v/  Hx uti's   No vag discharge or vag bleeding.

## 2016-02-02 NOTE — ED Notes (Signed)
Pt c/o dysuria, frequent urination, problems starting/stopping urination. Pt denies vaginal discharge and bleeding.

## 2016-02-02 NOTE — ED Provider Notes (Signed)
[facility] Emergency Department Provider Note  ____________________________________________  Time seen: Approximately 7:56 PM  I have reviewed the triage vital signs and the nursing notes.   HISTORY  Chief Complaint Dysuria    HPI Laurie Thornton is a 40 y.o. female who presents to the emergency department for evaluation of dysuria. She has had symptoms for the past 3 days. She has a history of urinary tract infections--the last one was about a year ago. She denies vaginal bleeding or discharge. She has some dull aching pain across her lower back and some suprapubic tenderness.  Past Medical History:  Diagnosis Date  . Anemia   . Anxiety   . Bipolar disorder ([place])   . Chronic abdominal pain   . Depression     Patient Active Problem List   Diagnosis Date Noted  . S/P laparoscopic hysterectomy 09/11/2014  . Bipolar disorder, current episode mixed, moderate ([place]) 09/03/2014    Past Surgical History:  Procedure Laterality Date  . ABDOMINAL HYSTERECTOMY    . [place], 2000    x 2  . KNEE SURGERY Right 1995, 1992   x 2  . LAPAROSCOPIC BILATERAL SALPINGECTOMY Bilateral 09/11/2014   Procedure: LAPAROSCOPIC BILATERAL SALPINGECTOMY;  Surgeon: Azucena Fallen, MD;  Location: [place] [facility];  Service: Gynecology;  Laterality: Bilateral;  . LAPAROSCOPIC LYSIS OF ADHESIONS N/A 09/11/2014   Procedure: LAPAROSCOPIC LYSIS OF ADHESIONS;  Surgeon: Azucena Fallen, MD;  Location: [place] [facility];  Service: Gynecology;  Laterality: N/A;  . MASS EXCISION  2002   right sided abd wall mass excision  . [place]  2012  . ROBOTIC ASSISTED TOTAL HYSTERECTOMY N/A 09/11/2014   Procedure: ROBOTIC ASSISTED TOTAL HYSTERECTOMY;  Surgeon: Azucena Fallen, MD;  Location: [facility];  Service: Gynecology;  Laterality: N/A;  . TUBAL LIGATION  2000    Prior to Admission medications   Medication Sig Start Date End Date Taking? Authorizing Provider  ciprofloxacin (CIPRO) 500 MG tablet  Take 1 tablet (500 mg total) by mouth 2 (two) times daily. 06/13/15   Johnn Hai, PA-C  clonazePAM (KLONOPIN) 0.5 MG tablet Take 0.5 mg by mouth daily as needed for anxiety.    Historical Provider, MD  docusate sodium (COLACE) 100 MG capsule Take 2 capsules (200 mg total) by mouth daily as needed (hard bowel movment). 10/12/14   Artelia Laroche, CNM  lamoTRIgine (LAMICTAL) 150 MG tablet Take 350 mg by mouth daily.     Historical Provider, MD  magnesium oxide (MAG-OX) 400 (241.3 MG) MG tablet Take 1 tablet (400 mg total) by mouth daily. 10/12/14   Artelia Laroche, CNM  ondansetron (ZOFRAN ODT) 4 MG disintegrating tablet Take 1 tablet (4 mg total) by mouth every 8 (eight) hours as needed for nausea or vomiting. 06/13/15   Johnn Hai, PA-C  phenazopyridine (PYRIDIUM) 200 MG tablet Take 1 tablet (200 mg total) by mouth 3 (three) times daily as needed for pain. 02/02/16   Victorino Dike, FNP  senna (SENOKOT) 8.6 MG TABS tablet Take 1 tablet by mouth daily as needed for mild constipation.    Historical Provider, MD  sertraline (ZOLOFT) 25 MG tablet Take 25 mg by mouth daily.    Historical Provider, MD  sulfamethoxazole-trimethoprim (BACTRIM DS,SEPTRA DS) 800-160 MG tablet Take 1 tablet by mouth 2 (two) times daily. 02/02/16   Victorino Dike, FNP  traZODone (DESYREL) 50 MG tablet Take 50 mg by mouth at bedtime.    Historical Provider, MD    Allergies  Amoxicillin  No family history on file.  Social History Social History  Substance Use Topics  . Smoking status: Never Smoker  . Smokeless tobacco: Never Used  . Alcohol use No    Review of Systems Constitutional: Negative for fever. Respiratory: Negative for shortness of breath or cough. Gastrointestinal: Positive for abdominal pain; negative for nausea , negative for vomiting. Genitourinary: Positive for dysuria , negative for vaginal discharge. Musculoskeletal: Positive for back pain. Skin: Warm, dry, negative for lesion or  wound. ____________________________________________   PHYSICAL EXAM:  VITAL SIGNS: ED Triage Vitals  Enc Vitals Group     BP 02/02/16 1924 134/89     Pulse Rate 02/02/16 1924 80     Resp 02/02/16 1924 18     Temp 02/02/16 1924 97.9 F (36.6 C)     Temp Source 02/02/16 1924 Oral     SpO2 02/02/16 1924 99 %     Weight 02/02/16 1923 190 lb (86.2 kg)     Height 02/02/16 1923 5\' 6"  (1.676 m)     Head Circumference --      Peak Flow --      Pain Score 02/02/16 1924 9     Pain Loc --      Pain Edu? --      Excl. in [place]? --     Constitutional: Alert and oriented. Well appearing and in no acute distress. Eyes: Conjunctivae are normal. PERRL. EOMI. Head: Atraumatic. Nose: No congestion/rhinnorhea. Mouth/Throat: Mucous membranes are moist. Respiratory: Normal respiratory effort.  No retractions. Gastrointestinal: Mild suprapubic tenderness to palpation. Genitourinary: Pelvic exam: Deferred Musculoskeletal: No extremity tenderness nor edema. No CVA tenderness Neurologic:  Normal speech and language. No gross focal neurologic deficits are appreciated. Speech is normal. No gait instability. Skin:  Skin is warm, dry and intact. No rash noted. Psychiatric: Mood and affect are normal. Speech and behavior are normal.  ____________________________________________   LABS (all labs ordered are listed, but only abnormal results are displayed)  Labs Reviewed  URINALYSIS, COMPLETE (UACMP) WITH MICROSCOPIC - Abnormal; Notable for the following:       Result Value   Color, Urine STRAW (*)    APPearance CLEAR (*)    Specific Gravity, Urine 1.002 (*)    Hgb urine dipstick MODERATE (*)    Leukocytes, UA MODERATE (*)    Squamous Epithelial / LPF 0-5 (*)    All other components within normal limits   ____________________________________________  RADIOLOGY  Not indicated ____________________________________________   PROCEDURES  Procedure(s) performed:  None  ____________________________________________   INITIAL IMPRESSION / ASSESSMENT AND PLAN / ED COURSE  Clinical Course      Pertinent labs & imaging results that were available during my care of the patient were reviewed by me and considered in my medical decision making (see chart for details).  40 year old female presenting to the emergency department with a three-day history of dysuria and transverse lower back pain. Symptoms are consistent with previous urinary tract infections. Her last UTI was approximately one year ago and she was treated with ciprofloxacin at that time. There is no CVA tenderness on exam. Tonight, she will be given prescriptions for Bactrim and Pyridium. She was advised to follow-up with the primary care provider of her choice for symptoms that are not improving over the next week. She was advised to return to the emergency department for symptoms that change or worsen if she's unable schedule an appointment. ___________________________________________   FINAL CLINICAL IMPRESSION(S) / ED DIAGNOSES  Final diagnoses:  Acute cystitis with hematuria    Note:  This document was prepared using Dragon voice recognition software and may include unintentional dictation errors.    Victorino Dike, FNP 02/02/16 2023    Harvest Dark, MD 02/17/16 [phone]

## 2016-10-13 ENCOUNTER — Emergency Department
Admission: EM | Admit: 2016-10-13 | Discharge: 2016-10-14 | Disposition: A | Discharge status: Home / Self Care | Payer: BLUE CROSS/BLUE SHIELD | Attending: Emergency Medicine | Admitting: Emergency Medicine

## 2016-10-13 DIAGNOSIS — R1030 Lower abdominal pain, unspecified: Secondary | ICD-10-CM | POA: Insufficient documentation

## 2016-10-13 DIAGNOSIS — Z79899 Other long term (current) drug therapy: Secondary | ICD-10-CM | POA: Insufficient documentation

## 2016-10-13 DIAGNOSIS — R102 Pelvic and perineal pain: Secondary | ICD-10-CM

## 2016-10-13 DIAGNOSIS — N898 Other specified noninflammatory disorders of vagina: Secondary | ICD-10-CM | POA: Insufficient documentation

## 2016-10-13 LAB — URINALYSIS, COMPLETE (UACMP) WITH MICROSCOPIC
Bacteria, UA: NONE SEEN
Bilirubin Urine: NEGATIVE
GLUCOSE, UA: NEGATIVE mg/dL
Ketones, ur: NEGATIVE mg/dL
Leukocytes, UA: NEGATIVE
NITRITE: NEGATIVE
PH: 7 (ref 5.0–8.0)
Protein, ur: NEGATIVE mg/dL
Specific Gravity, Urine: 1.006 (ref 1.005–1.030)

## 2016-10-13 LAB — COMPREHENSIVE METABOLIC PANEL
ALT: 13 U/L — ABNORMAL LOW (ref 14–54)
ANION GAP: 6 (ref 5–15)
AST: 19 U/L (ref 15–41)
Albumin: 4.4 g/dL (ref 3.5–5.0)
Alkaline Phosphatase: 62 U/L (ref 38–126)
BUN: 9 mg/dL (ref 6–20)
CALCIUM: 10 mg/dL (ref 8.9–10.3)
CHLORIDE: 100 mmol/L — AB (ref 101–111)
CO2: 31 mmol/L (ref 22–32)
Creatinine, Ser: 0.92 mg/dL (ref 0.44–1.00)
GFR calc non Af Amer: >60 mL/min (ref >60)
Glucose, Bld: 92 mg/dL (ref 65–99)
Potassium: 3.6 mmol/L (ref 3.5–5.1)
SODIUM: 137 mmol/L (ref 135–145)
Total Bilirubin: 0.3 mg/dL (ref 0.3–1.2)
Total Protein: 8.5 g/dL — ABNORMAL HIGH (ref 6.5–8.1)

## 2016-10-13 LAB — CBC
HCT: 39.3 % (ref 35.0–47.0)
HEMOGLOBIN: 13.3 g/dL (ref 12.0–16.0)
MCH: 31.5 pg (ref 26.0–34.0)
MCHC: 33.7 g/dL (ref 32.0–36.0)
MCV: 93.3 fL (ref 80.0–100.0)
Platelets: 315 10*3/uL (ref 150–440)
RBC: 4.21 MIL/uL (ref 3.80–5.20)
RDW: 13.5 % (ref 11.5–14.5)
WBC: 7.8 10*3/uL (ref 3.6–11.0)

## 2016-10-13 LAB — WET PREP, GENITAL
CLUE CELLS WET PREP: NONE SEEN
SPERM: NONE SEEN
Trich, Wet Prep: NONE SEEN
WBC WET PREP: NONE SEEN
Yeast Wet Prep HPF POC: NONE SEEN

## 2016-10-13 LAB — LIPASE, BLOOD: LIPASE: 32 U/L (ref 11–51)

## 2016-10-13 MED ORDER — AZITHROMYCIN 500 MG PO TABS
1000.0000 mg | ORAL_TABLET | Freq: Once | ORAL | Status: AC
  Administered 2016-10-13: 1000 mg via ORAL
  Filled 2016-10-13: qty 2

## 2016-10-13 MED ORDER — CEFTRIAXONE SODIUM 250 MG IJ SOLR
250.0000 mg | Freq: Once | INTRAMUSCULAR | Status: AC
  Administered 2016-10-13: 250 mg via INTRAMUSCULAR
  Filled 2016-10-13: qty 250

## 2016-10-13 NOTE — ED Triage Notes (Signed)
Pt presents to ED via POV with c/o pelvic pain "for 2 or 3 weeks" and vaginal d/c. Pt reports d/c is white in color; denies changes in urinary habits or frequency, no change in urine color, odor, or consistency. Pt also reports "a bug bite" on her LEFT calf 2 days ago that is "itchy". Pt denies any c/o N/V/D or fever, no reports of CP or SHOB. Pt is A&O, in NAD; RR even, regular, and unlabored. Skin color/temp is WNL.

## 2016-10-13 NOTE — ED Provider Notes (Signed)
[facility] Emergency Department Provider Note  Time seen: 11:06 PM  I have reviewed the triage vital signs and the nursing notes.   HISTORY  Chief Complaint Pelvic Pain    HPI Laurie Thornton is a 41 y.o. female With a past medical history of anemia, anxiety, bipolar, who presents to the emergency department for lower abdominal discomfort and vaginal discharge.according to the patient she had unprotected sex approximately 3 weeks ago for the past 1-2 weeks has had vaginal discharge and lower abdominal discomfort which she describes as mild, dull aching type pain across the entire lower abdomen. Denies any fever, nausea, vomiting, diarrhea. Denies dysuria. Describes the discharge is white  Past Medical History:  Diagnosis Date  . Anemia   . Anxiety   . Bipolar disorder ([place])   . Chronic abdominal pain   . Depression     Patient Active Problem List   Diagnosis Date Noted  . S/P laparoscopic hysterectomy 09/11/2014  . Bipolar disorder, current episode mixed, moderate ([place]) 09/03/2014    Past Surgical History:  Procedure Laterality Date  . ABDOMINAL HYSTERECTOMY    . [place], 2000    x 2  . KNEE SURGERY Right 1995, 1992   x 2  . LAPAROSCOPIC BILATERAL SALPINGECTOMY Bilateral 09/11/2014   Procedure: LAPAROSCOPIC BILATERAL SALPINGECTOMY;  Surgeon: Azucena Fallen, MD;  Location: [facility] [place] [facility];  Service: Gynecology;  Laterality: Bilateral;  . LAPAROSCOPIC LYSIS OF ADHESIONS N/A 09/11/2014   Procedure: LAPAROSCOPIC LYSIS OF ADHESIONS;  Surgeon: Azucena Fallen, MD;  Location: [facility];  Service: Gynecology;  Laterality: N/A;  . MASS EXCISION  2002   right sided abd wall mass excision  . [place]  2012  . ROBOTIC ASSISTED TOTAL HYSTERECTOMY N/A 09/11/2014   Procedure: ROBOTIC ASSISTED TOTAL HYSTERECTOMY;  Surgeon: Azucena Fallen, MD;  Location: [facility];  Service: Gynecology;  Laterality: N/A;  . TUBAL LIGATION  2000    Prior to Admission  medications   Medication Sig Start Date End Date Taking? Authorizing Provider  ciprofloxacin (CIPRO) 500 MG tablet Take 1 tablet (500 mg total) by mouth 2 (two) times daily. 06/13/15   Johnn Hai, PA-C  clonazePAM (KLONOPIN) 0.5 MG tablet Take 0.5 mg by mouth daily as needed for anxiety.    [provider]  docusate sodium (COLACE) 100 MG capsule Take 2 capsules (200 mg total) by mouth daily as needed (hard bowel movment). 10/12/14   Artelia Laroche, CNM  lamoTRIgine (LAMICTAL) 150 MG tablet Take 350 mg by mouth daily.     [provider]  magnesium oxide (MAG-OX) 400 (241.3 MG) MG tablet Take 1 tablet (400 mg total) by mouth daily. 10/12/14   Artelia Laroche, CNM  ondansetron (ZOFRAN ODT) 4 MG disintegrating tablet Take 1 tablet (4 mg total) by mouth every 8 (eight) hours as needed for nausea or vomiting. 06/13/15   Johnn Hai, PA-C  phenazopyridine (PYRIDIUM) 200 MG tablet Take 1 tablet (200 mg total) by mouth 3 (three) times daily as needed for pain. 02/02/16   Triplett, Dessa Phi, FNP  senna (SENOKOT) 8.6 MG TABS tablet Take 1 tablet by mouth daily as needed for mild constipation.    [provider]  sertraline (ZOLOFT) 25 MG tablet Take 25 mg by mouth daily.    [provider]  sulfamethoxazole-trimethoprim (BACTRIM DS,SEPTRA DS) 800-160 MG tablet Take 1 tablet by mouth 2 (two) times daily. 02/02/16   Triplett, Johnette Abraham B, FNP  traZODone (DESYREL) 50 MG tablet  Take 50 mg by mouth at bedtime.    [provider]    Allergies  Allergen Reactions  . Amoxicillin Hives    No family history on file.  Social History Social History  Substance Use Topics  . Smoking status: Never Smoker  . Smokeless tobacco: Never Used  . Alcohol use No    Review of Systems Constitutional: Negative for fever. Cardiovascular: Negative for chest pain. Respiratory: Negative for shortness of breath. Gastrointestinal: lower abdominal discomfort. Negative nausea  vomiting or diarrhea Genitourinary: Negative for dysuria.positive for vaginal discharge Musculoskeletal: Negative for back pain. Neurological: Negative for headache All other ROS negative  ____________________________________________   PHYSICAL EXAM:  VITAL SIGNS: ED Triage Vitals  Enc Vitals Group     BP 10/13/16 1944 113/84     Pulse Rate 10/13/16 1944 87     Resp 10/13/16 1944 17     Temp 10/13/16 1944 98.2 F (36.8 C)     Temp Source 10/13/16 1944 Oral     SpO2 10/13/16 1944 100 %     Weight 10/13/16 1945 180 lb (81.6 kg)     Height 10/13/16 1945 5\' 6"  (1.676 m)     Head Circumference --      Peak Flow --      Pain Score 10/13/16 1944 6     Pain Loc --      Pain Edu? --      Excl. in [place]? --     Constitutional: Alert and oriented. Well appearing and in no distress. Eyes: Normal exam ENT   Head: Normocephalic and atraumatic.   Mouth/Throat: Mucous membranes are moist. Cardiovascular: Normal rate, regular rhythm. No murmur Respiratory: Normal respiratory effort without tachypnea nor retractions. Breath sounds are clear Gastrointestinal: soft, slight suprapubic tenderness palpation, no rebound or guarding. No distention. Musculoskeletal: Nontender with normal range of motion in all extremities Neurologic:  Normal speech and language. No gross focal neurologic deficits  Skin:  Skin is warm, dry and intact.  Psychiatric: Mood and affect are normal  ____________________________________________   INITIAL IMPRESSION / ASSESSMENT AND PLAN / ED COURSE  Pertinent labs & imaging results that were available during my care of the patient were reviewed by me and considered in my medical decision making (see chart for details).  patient presents to the emergency department for lower abdominaldiscomfort with white vaginal discharge over the past 1-2 weeks after havingunprotected sex 3 weeks ago. I discussedtreatment with the patient, she is agreeable to this plan. We'll  perform a pelvic examination. Patient's labs are largely within normal limits, white blood cell count is normal. Urinalysis is normal.    pelvic exam shows a moderate amount of white discharge, largely nontender.  wet prep negative. Patient will be discharged home with PCP follow-up.  ____________________________________________   FINAL CLINICAL IMPRESSION(S) / ED DIAGNOSES  vaginal discharge Lower abdominal pain    Harvest Dark, MD 10/13/16 2350

## 2016-10-14 LAB — CHLAMYDIA/NGC RT PCR (ARMC ONLY)
CHLAMYDIA TR: NOT DETECTED
N gonorrhoeae: NOT DETECTED

## 2016-10-14 NOTE — ED Notes (Addendum)
Pt states she does not want BP checked prior to discharge, pt states she has a small dog at home and she needs to get home. DC instructions given to pt with verbal understanding from pt of instructions and follow up.

## 2016-10-16 ENCOUNTER — Telehealth: Payer: Self-pay | Admitting: Emergency Medicine

## 2016-10-16 NOTE — Telephone Encounter (Signed)
Patient called asking for std results.  Gave her results.

## 2017-05-10 ENCOUNTER — Other Ambulatory Visit: Payer: Self-pay

## 2017-05-10 ENCOUNTER — Encounter: Payer: Self-pay | Admitting: Emergency Medicine

## 2017-05-10 ENCOUNTER — Ambulatory Visit (INDEPENDENT_AMBULATORY_CARE_PROVIDER_SITE_OTHER): Payer: BLUE CROSS/BLUE SHIELD

## 2017-05-10 ENCOUNTER — Ambulatory Visit
Admission: EM | Admit: 2017-05-10 | Discharge: 2017-05-10 | Disposition: A | Discharge status: Home / Self Care | Payer: BLUE CROSS/BLUE SHIELD | Attending: Family Medicine | Admitting: Family Medicine

## 2017-05-10 DIAGNOSIS — R1084 Generalized abdominal pain: Secondary | ICD-10-CM

## 2017-05-10 DIAGNOSIS — R197 Diarrhea, unspecified: Secondary | ICD-10-CM

## 2017-05-10 LAB — URINALYSIS, COMPLETE (UACMP) WITH MICROSCOPIC
BACTERIA UA: NONE SEEN
Bilirubin Urine: NEGATIVE
Glucose, UA: NEGATIVE mg/dL
Ketones, ur: NEGATIVE mg/dL
Leukocytes, UA: NEGATIVE
Nitrite: NEGATIVE
PH: 7.5 (ref 5.0–8.0)
PROTEIN: NEGATIVE mg/dL
SPECIFIC GRAVITY, URINE: 1.01 (ref 1.005–1.030)
WBC, UA: NONE SEEN WBC/hpf (ref 0–5)

## 2017-05-10 LAB — COMPREHENSIVE METABOLIC PANEL
ALT: 16 U/L (ref 14–54)
AST: 23 U/L (ref 15–41)
Albumin: 4.4 g/dL (ref 3.5–5.0)
Alkaline Phosphatase: 57 U/L (ref 38–126)
Anion gap: 8 (ref 5–15)
BUN: 14 mg/dL (ref 6–20)
CO2: 25 mmol/L (ref 22–32)
Calcium: 9.8 mg/dL (ref 8.9–10.3)
Chloride: 102 mmol/L (ref 101–111)
Creatinine, Ser: 0.72 mg/dL (ref 0.44–1.00)
GFR calc Af Amer: >60 mL/min (ref >60)
GFR calc non Af Amer: >60 mL/min (ref >60)
Glucose, Bld: 95 mg/dL (ref 65–99)
Potassium: 3.8 mmol/L (ref 3.5–5.1)
Sodium: 135 mmol/L (ref 135–145)
Total Bilirubin: 0.8 mg/dL (ref 0.3–1.2)
Total Protein: 8.6 g/dL — ABNORMAL HIGH (ref 6.5–8.1)

## 2017-05-10 LAB — CBC WITH DIFFERENTIAL/PLATELET
Basophils Absolute: 0.1 10*3/uL (ref 0–0.1)
Basophils Relative: 1 %
Eosinophils Absolute: 0.1 10*3/uL (ref 0–0.7)
Eosinophils Relative: 1 %
HCT: 40 % (ref 35.0–47.0)
Hemoglobin: 13.6 g/dL (ref 12.0–16.0)
Lymphocytes Relative: 49 %
Lymphs Abs: 2.9 10*3/uL (ref 1.0–3.6)
MCH: 31.4 pg (ref 26.0–34.0)
MCHC: 34.1 g/dL (ref 32.0–36.0)
MCV: 92 fL (ref 80.0–100.0)
Monocytes Absolute: 0.6 10*3/uL (ref 0.2–0.9)
Monocytes Relative: 9 %
Neutro Abs: 2.4 10*3/uL (ref 1.4–6.5)
Neutrophils Relative %: 40 %
Platelets: 277 10*3/uL (ref 150–440)
RBC: 4.35 MIL/uL (ref 3.80–5.20)
RDW: 12.8 % (ref 11.5–14.5)
WBC: 6 10*3/uL (ref 3.6–11.0)

## 2017-05-10 MED ORDER — DICYCLOMINE HCL 20 MG PO TABS: 20.0000 mg | ORAL_TABLET | Freq: Three times a day (TID) | ORAL | 0 refills | Status: DC

## 2017-05-10 NOTE — ED Provider Notes (Signed)
[facility]    CSN: [id number] Arrival date & time: 05/10/17  1503     History   Chief Complaint Chief Complaint  Patient presents with  . Abdominal Pain    APPT    HPI Laurie Thornton is a 42 y.o. female.   HPI  Is a 42 year old female with 2 days of abdominal pain flank pain and back pain that she indicates infrascapularly.  Has noticed urinary frequency but has had no dysuria.  Nausea but no vomiting has had diarrhea with form the last 2 days.  She is noticed no blood or mucus in the stool.  She states that prior to the diarrhea she has been having episodes of constipation for the last several months.  Her pain is a colicky type of pain and a usually periumbilical.  She states that when it is worse it is a 10 and is usually 3 when not severe.  He has had endometrial surgery and her GYN has told her in the past that she does have adhesions.  Review of her past medical records available to me on care everywhere shows her to have a recurrent chronic abdominal pain.  She has had lysis of adhesions in the past as well.  She has noticed less abdominal pain when she "eats clean".  Not have a primary care physician but relies on her OB/GYN.          Past Medical History:  Diagnosis Date  . Anemia   . Anxiety   . Bipolar disorder ([place])   . Chronic abdominal pain   . Depression     Patient Active Problem List   Diagnosis Date Noted  . S/P laparoscopic hysterectomy 09/11/2014  . Bipolar disorder, current episode mixed, moderate ([place]) 09/03/2014    Past Surgical History:  Procedure Laterality Date  . ABDOMINAL HYSTERECTOMY    . [place], 2000    x 2  . KNEE SURGERY Right 1995, 1992   x 2  . LAPAROSCOPIC BILATERAL SALPINGECTOMY Bilateral 09/11/2014   Procedure: LAPAROSCOPIC BILATERAL SALPINGECTOMY;  Surgeon: Azucena Fallen, MD;  Location: [facility];  Service: Gynecology;  Laterality: Bilateral;  . LAPAROSCOPIC LYSIS OF ADHESIONS N/A 09/11/2014   Procedure: LAPAROSCOPIC LYSIS OF ADHESIONS;  Surgeon: Azucena Fallen, MD;  Location: [facility];  Service: Gynecology;  Laterality: N/A;  . MASS EXCISION  2002   right sided abd wall mass excision  . [place]  2012  . ROBOTIC ASSISTED TOTAL HYSTERECTOMY N/A 09/11/2014   Procedure: ROBOTIC ASSISTED TOTAL HYSTERECTOMY;  Surgeon: Azucena Fallen, MD;  Location: [facility];  Service: Gynecology;  Laterality: N/A;  . TUBAL LIGATION  2000    OB History    Gravida  3   Para  2   Term      Preterm      AB  1   Living  2     SAB  1   TAB  0   Ectopic  0   Multiple  0   Live Births               Home Medications    Prior to Admission medications   Medication Sig Start Date End Date Taking? Authorizing Provider  clonazePAM (KLONOPIN) 0.5 MG tablet Take 0.5 mg by mouth daily as needed for anxiety.   Yes [provider]  docusate sodium (COLACE) 100 MG capsule Take 2 capsules (200 mg total) by mouth daily as needed (hard bowel movment). 10/12/14  Yes Artelia Laroche, CNM  lamoTRIgine (LAMICTAL) 150 MG tablet Take 350 mg by mouth daily.    Yes [provider]  sertraline (ZOLOFT) 25 MG tablet Take 25 mg by mouth daily.   Yes [provider]  traZODone (DESYREL) 50 MG tablet Take 50 mg by mouth at bedtime.   Yes [provider]  dicyclomine (BENTYL) 20 MG tablet Take 1 tablet (20 mg total) by mouth 4 (four) times daily -  before meals and at bedtime. 05/10/17   Lorin Picket, PA-C    Family History Family History  Problem Relation Age of Onset  . Deep vein thrombosis Father     Social History Social History   Tobacco Use  . Smoking status: Never Smoker  . Smokeless tobacco: Never Used  Substance Use Topics  . Alcohol use: No  . Drug use: No     Allergies   Amoxicillin   Review of Systems Review of Systems  Constitutional: Positive for activity change, appetite change and chills. Negative for fever.  Gastrointestinal:  Positive for abdominal pain, diarrhea and nausea. Negative for vomiting.  Genitourinary: Negative for vaginal bleeding, vaginal discharge and vaginal pain.  All other systems reviewed and are negative.    Physical Exam Triage Vital Signs ED Triage Vitals  Enc Vitals Group     BP 05/10/17 1518 123/86     Pulse Rate 05/10/17 1518 80     Resp 05/10/17 1518 16     Temp 05/10/17 1518 98.2 F (36.8 C)     Temp Source 05/10/17 1518 Oral     SpO2 05/10/17 1518 100 %     Weight 05/10/17 1518 183 lb (83 kg)     Height 05/10/17 1518 5\' 6"  (1.676 m)     Head Circumference --      Peak Flow --      Pain Score 05/10/17 1517 10     Pain Loc --      Pain Edu? --      Excl. in [place]? --    No data found.  Updated Vital Signs BP 123/86 (BP Location: Left Arm)   Pulse 80   Temp 98.2 F (36.8 C) (Oral)   Resp 16   Ht 5\' 6"  (1.676 m)   Wt 183 lb (83 kg)   LMP 09/02/2014   SpO2 100%   BMI 29.54 kg/m   Visual Acuity Right Eye Distance:   Left Eye Distance:   Bilateral Distance:    Right Eye Near:   Left Eye Near:    Bilateral Near:     Physical Exam  Constitutional: She is oriented to person, place, and time. She appears well-developed and well-nourished.  Non-toxic appearance. She does not appear ill. No distress.  HENT:  Head: Normocephalic.  Mouth/Throat: Oropharynx is clear and moist.  Eyes: Pupils are equal, round, and reactive to light.  Pulmonary/Chest: Effort normal and breath sounds normal.  Abdominal: Bowel sounds are decreased. There is generalized tenderness. There is guarding. There is no rigidity.  Abdominal examination was performed with Nevin Bloodgood, CMA as assistant and chaperone.  The patient has CVA tenderness bilaterally seemed out of proportion to the amount of percussion used.  Also tender along her entire cervical thoracic and lumbar spine.  Abdominal exam shows no distention.  Heart sounds are decreased and hypotonic.  In the light palpation causes the patient to  react in all quadrants.  She tries to move the examiner's hand out of the way.  She  has no significant guarding no rigidity no significant rebound at this point.  Neurological: She is alert and oriented to person, place, and time.  Skin: Skin is warm and dry.  Psychiatric: She has a normal mood and affect. Her behavior is normal.  Nursing note and vitals reviewed.    UC Treatments / Results  Labs (all labs ordered are listed, but only abnormal results are displayed) Labs Reviewed  URINALYSIS, COMPLETE (UACMP) WITH MICROSCOPIC - Abnormal; Notable for the following components:      Result Value   Color, Urine STRAW (*)    Hgb urine dipstick SMALL (*)    Squamous Epithelial / LPF 0-5 (*)    All other components within normal limits  COMPREHENSIVE METABOLIC PANEL - Abnormal; Notable for the following components:   Total Protein 8.6 (*)    All other components within normal limits  CBC WITH DIFFERENTIAL/PLATELET    EKG  EKG Interpretation None       Radiology Dg Abd 2 Views  Result Date: 05/10/2017 CLINICAL DATA:  Right lower quadrant abdominal pain EXAM: ABDOMEN - 2 VIEW COMPARISON:  CT 10/12/2014 FINDINGS: Lung bases are clear. No free air beneath the diaphragm. Bilateral nipple piercings. Nonobstructed bowel-gas pattern. Multiple calcified phleboliths in the pelvis. IMPRESSION: Non obstructed bowel gas pattern. Electronically Signed   By: Donavan Foil M.D.   On: 05/10/2017 17:14    Procedures Procedures (including critical care time)  Medications Ordered in UC Medications - No data to display   Initial Impression / Assessment and Plan / UC Course  I have reviewed the triage vital signs and the nursing notes.  Pertinent labs & imaging results that were available during my care of the patient were reviewed by me and considered in my medical decision making (see chart for details).     Plan: 1. Test/x-ray results and diagnosis reviewed with patient 2. rx as per  orders; risks, benefits, potential side effects reviewed with patient 3. Recommend supportive treatment with a more fiber in the diet recommended Metamucil on a daily basis.  Laboratory results and x-rays are encouraging with no significant abnormality seen.  PreScribe short course of Bentyl to see if this will help with her pain.  She worsens she should go to the emergency room.  Otherwise she has a follow-up appoint with her OB on April 8 which I have encouraged her to keep or to see if she can be seen sooner. 4. F/u prn if symptoms worsen or don't improve   Final Clinical Impressions(s) / UC Diagnoses   Final diagnoses:  Generalized abdominal pain  Diarrhea, unspecified type    ED Discharge Orders        Ordered    dicyclomine (BENTYL) 20 MG tablet  3 times daily before meals & bedtime     05/10/17 1727       Controlled Substance Prescriptions [place] Controlled Substance Registry consulted? Not Applicable   Lorin Picket, PA-C 05/10/17 1756

## 2017-05-10 NOTE — ED Triage Notes (Signed)
Patient in today c/o 2 days of abdominal pain, flank pain and back pain. Patient does have urinary frequency, but denies dysuria. Patient hasn't taken temperature, but having hot flashes.

## 2017-05-10 NOTE — Discharge Instructions (Signed)
If your symptoms worsen  go to the emergency room °

## 2017-05-11 ENCOUNTER — Other Ambulatory Visit: Payer: Self-pay

## 2017-05-11 ENCOUNTER — Emergency Department
Admission: EM | Admit: 2017-05-11 | Discharge: 2017-05-11 | Disposition: A | Discharge status: Home / Self Care | Payer: BLUE CROSS/BLUE SHIELD | Attending: Emergency Medicine | Admitting: Emergency Medicine

## 2017-05-11 ENCOUNTER — Emergency Department: Payer: BLUE CROSS/BLUE SHIELD

## 2017-05-11 DIAGNOSIS — K5900 Constipation, unspecified: Secondary | ICD-10-CM | POA: Diagnosis not present

## 2017-05-11 DIAGNOSIS — Z79899 Other long term (current) drug therapy: Secondary | ICD-10-CM | POA: Insufficient documentation

## 2017-05-11 DIAGNOSIS — R1084 Generalized abdominal pain: Secondary | ICD-10-CM | POA: Diagnosis present

## 2017-05-11 MED ORDER — IOPAMIDOL (ISOVUE-300) INJECTION 61%
100.0000 mL | Freq: Once | INTRAVENOUS | Status: AC | PRN
  Administered 2017-05-11: 100 mL via INTRAVENOUS

## 2017-05-11 NOTE — ED Notes (Signed)
ED Provider at bedside. 

## 2017-05-11 NOTE — ED Notes (Signed)
IV discontinued per pt request, explained she might have IV meds ordered after being seen but pt insists on removal.

## 2017-05-11 NOTE — ED Notes (Signed)
Pt wants lab results; explained to pt that the ED provider will inform her of such; pt wants a room now; explained to pt that she is awaiting on an exam room to become available and should be the next pt to go back pending any emergencies; pt voices understanding and sits in lobby

## 2017-05-11 NOTE — ED Notes (Signed)
Pt. Verbalizes understanding of d/c instructions, medications, and follow-up. VS stable.  Pt. In NAD at time of d/c and denies further concerns regarding this visit. Pt. Stable at the time of departure from the unit, departing unit by the safest and most appropriate manner per that pt condition and limitations with all belongings accounted for. Pt advised to return to the ED at any time for emergent concerns, or for new/worsening symptoms.   

## 2017-05-11 NOTE — ED Triage Notes (Signed)
Pt in with co rlq pain that started yesterday, was seen at urgent care yesterday, had blood, ua, and imaging done. States all were normal.

## 2017-05-11 NOTE — ED Notes (Signed)
Pt came up to triage nurse stating she wants to leave. States she just wants her results and go home. Explained to patient that she had just been waiting for her turn while Ct was done and resulted. Reexplained the ED process and reason for long wait time and reason for doing protocols.  Encouraged patient to stay and explained to her that as soon as she sees EDP he will be able to go over results and disposition. Patient insists on leaving and having IV discontinued, reemphasized to patient that she is next to go back and will not be much longer. Patient still seems frustrated but is willing to sit and wait for available bed.

## 2017-05-11 NOTE — ED Provider Notes (Signed)
[facility] Emergency Department Provider Note  ____________________________________________   None    (approximate)  I have reviewed the triage vital signs and the nursing notes.   HISTORY  Chief Complaint Abdominal Pain    HPI Laurie Thornton is a 42 y.o. female with medical history as listed below who presents for evaluation of abdominal pain.  She states that she has had abdominal pain for the last couple of days.  She describes it as severe when it occurs but it comes and goes and feels like a strong cramping throughout her abdomen.  Nothing particular makes it better or worse.  She denies any nausea or vomiting but has not had a bowel movement for 4 or 5 days.  She states that she has a history of constipation but does not take medication regularly although she has used various over-the-counter remedies such as Colace, enemas, and MiraLAX in the past to varying degrees of success.  She denies fever/chills, chest pain, shortness of breath, and dysuria.  She went to an urgent care earlier today and had lab work done which was all normal and reassuring.  They told her if her pain continues she should go to the emergency department which she has done.  Past Medical History:  Diagnosis Date  . Anemia   . Anxiety   . Bipolar disorder ([place])   . Chronic abdominal pain   . Depression     Patient Active Problem List   Diagnosis Date Noted  . S/P laparoscopic hysterectomy 09/11/2014  . Bipolar disorder, current episode mixed, moderate ([place]) 09/03/2014    Past Surgical History:  Procedure Laterality Date  . ABDOMINAL HYSTERECTOMY    . [place], 2000    x 2  . KNEE SURGERY Right 1995, 1992   x 2  . LAPAROSCOPIC BILATERAL SALPINGECTOMY Bilateral 09/11/2014   Procedure: LAPAROSCOPIC BILATERAL SALPINGECTOMY;  Surgeon: Azucena Fallen, MD;  Location: [facility];  Service: Gynecology;  Laterality: Bilateral;  . LAPAROSCOPIC LYSIS OF ADHESIONS N/A  09/11/2014   Procedure: LAPAROSCOPIC LYSIS OF ADHESIONS;  Surgeon: Azucena Fallen, MD;  Location: [facility];  Service: Gynecology;  Laterality: N/A;  . MASS EXCISION  2002   right sided abd wall mass excision  . [place]  2012  . ROBOTIC ASSISTED TOTAL HYSTERECTOMY N/A 09/11/2014   Procedure: ROBOTIC ASSISTED TOTAL HYSTERECTOMY;  Surgeon: Azucena Fallen, MD;  Location: [facility];  Service: Gynecology;  Laterality: N/A;  . TUBAL LIGATION  2000    Prior to Admission medications   Medication Sig Start Date End Date Taking? Authorizing Provider  clonazePAM (KLONOPIN) 0.5 MG tablet Take 0.5 mg by mouth daily as needed for anxiety.    [provider]  dicyclomine (BENTYL) 20 MG tablet Take 1 tablet (20 mg total) by mouth 4 (four) times daily -  before meals and at bedtime. 05/10/17   Lorin Picket, PA-C  docusate sodium (COLACE) 100 MG capsule Take 2 capsules (200 mg total) by mouth daily as needed (hard bowel movment). 10/12/14   Artelia Laroche, CNM  lamoTRIgine (LAMICTAL) 150 MG tablet Take 350 mg by mouth daily.     [provider]  sertraline (ZOLOFT) 25 MG tablet Take 25 mg by mouth daily.    [provider]  traZODone (DESYREL) 50 MG tablet Take 50 mg by mouth at bedtime.    [provider]    Allergies Amoxicillin  Family History  Problem Relation Age of Onset  . Deep  vein thrombosis Father     Social History Social History   Tobacco Use  . Smoking status: Never Smoker  . Smokeless tobacco: Never Used  Substance Use Topics  . Alcohol use: No  . Drug use: No    Review of Systems Constitutional: No fever/chills Eyes: No visual changes. ENT: No sore throat. Cardiovascular: Denies chest pain. Respiratory: Denies shortness of breath. Gastrointestinal: Abdominal pain as described above with history of constipation Genitourinary: Negative for dysuria or vaginal pain/discharge Musculoskeletal: Negative for neck pain.  Negative for back  pain. Integumentary: Negative for rash. Neurological: Negative for headaches, focal weakness or numbness.   ____________________________________________   PHYSICAL EXAM:  VITAL SIGNS: ED Triage Vitals  Enc Vitals Group     BP 05/11/17 0031 (!) 148/86     Pulse Rate 05/11/17 0031 88     Resp 05/11/17 0031 20     Temp 05/11/17 0031 98.2 F (36.8 C)     Temp Source 05/11/17 0031 Oral     SpO2 05/11/17 0031 100 %     Weight 05/11/17 0032 83 kg (183 lb)     Height 05/11/17 0032 1.676 m (5\' 6" )     Head Circumference --      Peak Flow --      Pain Score 05/11/17 0032 10     Pain Loc --      Pain Edu? --      Excl. in [place]? --     Constitutional: Alert and oriented. Well appearing and in no acute distress. Eyes: Conjunctivae are normal.  Head: Atraumatic. Nose: No congestion/rhinnorhea. Mouth/Throat: Mucous membranes are moist. Neck: No stridor.  No meningeal signs.   Cardiovascular: Normal rate, regular rhythm. Good peripheral circulation. Grossly normal heart sounds. Respiratory: Normal respiratory effort.  No retractions. Lungs CTAB. Gastrointestinal: Soft with diffuse mild tenderness throughout with no focal tenderness.  No rebound, no guarding, no distention. Musculoskeletal: No lower extremity tenderness nor edema. No gross deformities of extremities. Neurologic:  Normal speech and language. No gross focal neurologic deficits are appreciated.  Skin:  Skin is warm, dry and intact. No rash noted. Psychiatric: Mood and affect are normal. Speech and behavior are normal.  ____________________________________________   LABS (all labs ordered are listed, but only abnormal results are displayed)  Labs Reviewed - No data to display ____________________________________________  EKG  None - EKG not ordered by ED physician ____________________________________________  RADIOLOGY   ED MD interpretation: No evidence of acute abnormality, large amount of retained stool  throughout the colon consistent with constipation  Official radiology report(s): Ct Abdomen Pelvis W Contrast  Result Date: 05/11/2017 CLINICAL DATA:  Right lower quadrant pain starting yesterday. EXAM: CT ABDOMEN AND PELVIS WITH CONTRAST TECHNIQUE: Multidetector CT imaging of the abdomen and pelvis was performed using the standard protocol following bolus administration of intravenous contrast. CONTRAST:  176mL ISOVUE-300 IOPAMIDOL (ISOVUE-300) INJECTION 61% COMPARISON:  10/12/2014 FINDINGS: Lower chest: No acute abnormality. Hepatobiliary: No focal liver abnormality is seen. No gallstones, gallbladder wall thickening, or biliary dilatation. Pancreas: Unremarkable. No pancreatic ductal dilatation or surrounding inflammatory changes. Spleen: Normal in size without focal abnormality. Adrenals/Urinary Tract: Adrenal glands are unremarkable. Kidneys are normal, without renal calculi, focal lesion, or hydronephrosis. Bladder is unremarkable. Stomach/Bowel: The stomach is distended with ingested food material. No small bowel dilatation or obstruction. A significant amount of stool is seen throughout the colon. Normal-appearing appendix. Punctate calcification adjacent to the cecum may represent a tiny appendicolith or vascular phlebolith. Vascular/Lymphatic: No significant vascular  findings are present. No enlarged abdominal or pelvic lymph nodes. Reproductive: Status post hysterectomy. No adnexal masses. Other: Small amount of free fluid in the pelvis.  No free air. Musculoskeletal: No acute or significant osseous findings. IMPRESSION: 1. Large amount of retained stool is noted within the colon consistent constipation. No bowel obstruction or inflammation. 2. Normal caliber appendix with possible appendicolith. No appendicitis. Electronically Signed   By: Ashley Royalty M.D.   On: 05/11/2017 02:37   Dg Abd 2 Views  Result Date: 05/10/2017 CLINICAL DATA:  Right lower quadrant abdominal pain EXAM: ABDOMEN - 2 VIEW  COMPARISON:  CT 10/12/2014 FINDINGS: Lung bases are clear. No free air beneath the diaphragm. Bilateral nipple piercings. Nonobstructed bowel-gas pattern. Multiple calcified phleboliths in the pelvis. IMPRESSION: Non obstructed bowel gas pattern. Electronically Signed   By: Donavan Foil M.D.   On: 05/10/2017 17:14    ____________________________________________   PROCEDURES  Critical Care performed: No   Procedure(s) performed:   Procedures   ____________________________________________   INITIAL IMPRESSION / ASSESSMENT AND PLAN / ED COURSE  As part of my medical decision making, I reviewed the following data within the [place] notes reviewed and incorporated and Labs reviewed     Differential diagnosis includes, but is not limited to, ovarian cyst, ovarian torsion, acute appendicitis, diverticulitis, urinary tract infection/pyelonephritis, endometriosis, bowel obstruction, colitis, renal colic, gastroenteritis, hernia, fibroids, endometriosis, pregnancy related pain including ectopic pregnancy, etc. however, the patient's labs are all reassuring, vital signs are stable, and her history of present illness and CT findings are all consistent with pain due to constipation.  She is not having any genitourinary symptoms and she has mild diffuse tenderness throughout her abdomen.  Strongly doubt tubo-ovarian abscess or PID given the lack of any other symptoms and based on the large amount of retained stool throughout her colon.  I had a long discussion with her at bedside regarding the use of over-the-counter medications.  She already has a follow-up appointment scheduled with GYN who she sees as her primary provider and she will follow-up with that provider as well, but I am also giving her the name and number of a GI doctor with whom she can follow-up for her chronic constipation/IBS symptoms.  She understands and agrees with the plan.      ____________________________________________  FINAL CLINICAL IMPRESSION(S) / ED DIAGNOSES  Final diagnoses:  Constipation, unspecified constipation type  Generalized abdominal pain     MEDICATIONS GIVEN DURING THIS VISIT:  Medications  iopamidol (ISOVUE-300) 61 % injection 100 mL (100 mLs Intravenous Contrast Given 05/11/17 0135)     ED Discharge Orders    None       Note:  This document was prepared using Dragon voice recognition software and may include unintentional dictation errors.    Hinda Kehr, MD 05/11/17 [phone]

## 2017-05-11 NOTE — Discharge Instructions (Signed)
You were seen in the emergency department today for abdominal pain that we believe to be caused by constipation.  We recommend that you use one or more of the following over-the-counter medications in the order described:   1)  Colace (or Dulcolax) 100 mg:  This is a stool softener, and you may take it once or twice a day as needed. 2)  Senna tablets:  This is a bowel stimulant that will help "push" out your stool. It is the next step to add after you have tried a stool softener. 3)  Miralax (powder):  This medication works by drawing additional fluid into your intestines and helps to flush out your stool.  Mix the powder with water or juice according to label instructions.  It may help if the Colace and Senna are not sufficient, but you must be sure to use the recommended amount of water or juice when you mix up the powder. 4)  Look for magnesium citrate at the pharmacy (it is usually a small glass bottle).  Drink the bottle according to the label instructions.  Remember that narcotic pain medications are constipating, so avoid them or minimize their use.  Drink plenty of fluids.  Please return to the Emergency Department immediately if you develop new or worsening symptoms that concern you, such as (but not limited to) fever > 101 degrees, severe abdominal pain, or persistent vomiting.

## 2018-02-04 ENCOUNTER — Emergency Department (HOSPITAL_COMMUNITY): Payer: Managed Care, Other (non HMO)

## 2018-02-04 ENCOUNTER — Encounter (HOSPITAL_COMMUNITY): Payer: Self-pay

## 2018-02-04 ENCOUNTER — Emergency Department (HOSPITAL_COMMUNITY)
Admission: EM | Admit: 2018-02-04 | Discharge: 2018-02-04 | Disposition: A | Discharge status: Home / Self Care | Payer: Managed Care, Other (non HMO) | Attending: Emergency Medicine | Admitting: Emergency Medicine

## 2018-02-04 ENCOUNTER — Other Ambulatory Visit: Payer: Self-pay

## 2018-02-04 DIAGNOSIS — R112 Nausea with vomiting, unspecified: Secondary | ICD-10-CM | POA: Diagnosis not present

## 2018-02-04 DIAGNOSIS — N200 Calculus of kidney: Secondary | ICD-10-CM | POA: Insufficient documentation

## 2018-02-04 DIAGNOSIS — Z79899 Other long term (current) drug therapy: Secondary | ICD-10-CM | POA: Insufficient documentation

## 2018-02-04 DIAGNOSIS — R109 Unspecified abdominal pain: Secondary | ICD-10-CM | POA: Diagnosis not present

## 2018-02-04 DIAGNOSIS — R3121 Asymptomatic microscopic hematuria: Secondary | ICD-10-CM | POA: Diagnosis not present

## 2018-02-04 LAB — URINALYSIS, ROUTINE W REFLEX MICROSCOPIC
BILIRUBIN URINE: NEGATIVE
Glucose, UA: NEGATIVE mg/dL
Ketones, ur: NEGATIVE mg/dL
LEUKOCYTES UA: NEGATIVE
Nitrite: NEGATIVE
PROTEIN: NEGATIVE mg/dL
SPECIFIC GRAVITY, URINE: 1.023 (ref 1.005–1.030)
pH: 6 (ref 5.0–8.0)

## 2018-02-04 LAB — PREGNANCY, URINE: Preg Test, Ur: NEGATIVE

## 2018-02-04 MED ORDER — KETOROLAC TROMETHAMINE 60 MG/2ML IM SOLN
60.0000 mg | Freq: Once | INTRAMUSCULAR | Status: AC
  Administered 2018-02-04: 60 mg via INTRAMUSCULAR
  Filled 2018-02-04: qty 2

## 2018-02-04 NOTE — ED Provider Notes (Signed)
[facility] EMERGENCY DEPARTMENT Provider Note   CSN: [id number] Arrival date & time: 02/04/18  0435  Time seen 6:08 AM   History   Chief Complaint Chief Complaint  Patient presents with  . Flank Pain    HPI Laurie Thornton is a 42 y.o. female.  HPI patient states December 16 she started getting right-sided abdominal pain it indicates her whole right abdomen and her right back.  She states the pain is been there constantly.  She states standing for prolonged time makes the pain worse and laying down makes it better.  She had nausea and vomiting twice yesterday morning.  She denies fever but has been having chills.  She started getting dysuria and frequency the same day.  She denies any hematuria.  She states she is never had this pain before.  PCP Patient, No Pcp Per   Past Medical History:  Diagnosis Date  . Anemia   . Anxiety   . Bipolar disorder ([place])   . Chronic abdominal pain   . Depression     Patient Active Problem List   Diagnosis Date Noted  . S/P laparoscopic hysterectomy 09/11/2014  . Bipolar disorder, current episode mixed, moderate ([place]) 09/03/2014    Past Surgical History:  Procedure Laterality Date  . ABDOMINAL HYSTERECTOMY    . [place], 2000    x 2  . KNEE SURGERY Right 1995, 1992   x 2  . LAPAROSCOPIC BILATERAL SALPINGECTOMY Bilateral 09/11/2014   Procedure: LAPAROSCOPIC BILATERAL SALPINGECTOMY;  Surgeon: Azucena Fallen, MD;  Location: [facility];  Service: Gynecology;  Laterality: Bilateral;  . LAPAROSCOPIC LYSIS OF ADHESIONS N/A 09/11/2014   Procedure: LAPAROSCOPIC LYSIS OF ADHESIONS;  Surgeon: Azucena Fallen, MD;  Location: [facility];  Service: Gynecology;  Laterality: N/A;  . MASS EXCISION  2002   right sided abd wall mass excision  . [place]  2012  . ROBOTIC ASSISTED TOTAL HYSTERECTOMY N/A 09/11/2014   Procedure: ROBOTIC ASSISTED TOTAL HYSTERECTOMY;  Surgeon: Azucena Fallen, MD;  Location: [facility];  Service: Gynecology;   Laterality: N/A;  . TUBAL LIGATION  2000     OB History    Gravida  3   Para  2   Term      Preterm      AB  1   Living  2     SAB  1   TAB  0   Ectopic  0   Multiple  0   Live Births               Home Medications    Prior to Admission medications   Medication Sig Start Date End Date Taking? Authorizing Provider  clonazePAM (KLONOPIN) 0.5 MG tablet Take 0.5 mg by mouth daily as needed for anxiety.    [provider]  dicyclomine (BENTYL) 20 MG tablet Take 1 tablet (20 mg total) by mouth 4 (four) times daily -  before meals and at bedtime. 05/10/17   Lorin Picket, PA-C  docusate sodium (COLACE) 100 MG capsule Take 2 capsules (200 mg total) by mouth daily as needed (hard bowel movment). 10/12/14   Artelia Laroche, CNM  lamoTRIgine (LAMICTAL) 150 MG tablet Take 350 mg by mouth daily.     [provider]  sertraline (ZOLOFT) 25 MG tablet Take 25 mg by mouth daily.    [provider]  traZODone (DESYREL) 50 MG tablet Take 50 mg by mouth at bedtime.    [provider]  Family History Family History  Problem Relation Age of Onset  . Deep vein thrombosis Father     Social History Social History   Tobacco Use  . Smoking status: Never Smoker  . Smokeless tobacco: Never Used  Substance Use Topics  . Alcohol use: No  . Drug use: No  employed   Allergies   Amoxicillin   Review of Systems Review of Systems  All other systems reviewed and are negative.    Physical Exam Updated Vital Signs BP 122/77   Pulse 80   Temp 98.1 F (36.7 C) (Oral)   Resp 18   Ht 5\' 8"  (1.727 m)   Wt 90.7 kg   LMP 09/02/2014   SpO2 100%   BMI 30.41 kg/m   Vital signs normal    Physical Exam Vitals signs and nursing note reviewed.  Constitutional:      General: She is not in acute distress.    Appearance: Normal appearance. She is well-developed. She is not ill-appearing or toxic-appearing.     Comments: Pleasant in no  distress  HENT:     Head: Normocephalic and atraumatic.     Right Ear: External ear normal.     Left Ear: External ear normal.     Nose: Nose normal. No mucosal edema or rhinorrhea.     Mouth/Throat:     Dentition: No dental abscesses.     Pharynx: No uvula swelling.  Eyes:     Conjunctiva/sclera: Conjunctivae normal.     Pupils: Pupils are equal, round, and reactive to light.  Neck:     Musculoskeletal: Full passive range of motion without pain, normal range of motion and neck supple.  Cardiovascular:     Rate and Rhythm: Normal rate and regular rhythm.     Heart sounds: Normal heart sounds. No murmur. No friction rub. No gallop.   Pulmonary:     Effort: Pulmonary effort is normal. No respiratory distress.     Breath sounds: Normal breath sounds. No wheezing, rhonchi or rales.  Chest:     Chest wall: No tenderness or crepitus.  Abdominal:     General: Bowel sounds are normal. There is no distension.     Palpations: Abdomen is soft.     Tenderness: There is abdominal tenderness. There is no guarding or rebound.    Musculoskeletal: Normal range of motion.        General: No tenderness.       Back:     Comments: Moves all extremities well.   Skin:    General: Skin is warm and dry.     Coloration: Skin is not pale.     Findings: No erythema or rash.  Neurological:     Mental Status: She is alert and oriented to person, place, and time.     Cranial Nerves: No cranial nerve deficit.  Psychiatric:        Mood and Affect: Mood is not anxious.        Speech: Speech normal.        Behavior: Behavior normal.      ED Treatments / Results  Labs (all labs ordered are listed, but only abnormal results are displayed) Results for orders placed or performed during the hospital encounter of 02/04/18  Pregnancy, urine  Result Value Ref Range   Preg Test, Ur NEGATIVE NEGATIVE  Urinalysis, Routine w reflex microscopic  Result Value Ref Range   Color, Urine YELLOW YELLOW    APPearance HAZY (A) CLEAR  Specific Gravity, Urine 1.023 1.005 - 1.030   pH 6.0 5.0 - 8.0   Glucose, UA NEGATIVE NEGATIVE mg/dL   Hgb urine dipstick MODERATE (A) NEGATIVE   Bilirubin Urine NEGATIVE NEGATIVE   Ketones, ur NEGATIVE NEGATIVE mg/dL   Protein, ur NEGATIVE NEGATIVE mg/dL   Nitrite NEGATIVE NEGATIVE   Leukocytes, UA NEGATIVE NEGATIVE   RBC / HPF 21-50 0 - 5 RBC/hpf   WBC, UA 0-5 0 - 5 WBC/hpf   Bacteria, UA RARE (A) NONE SEEN   Squamous Epithelial / LPF 11-20 0 - 5   Mucus PRESENT    Laboratory interpretation all normal except persistent hematuria since 2016, contaminated sample today with hematuria    EKG None  Radiology Ct Renal Stone Study  Result Date: 02/04/2018 CLINICAL DATA:  Flank pain. EXAM: CT ABDOMEN AND PELVIS WITHOUT CONTRAST TECHNIQUE: Multidetector CT imaging of the abdomen and pelvis was performed following the standard protocol without IV contrast. COMPARISON:  CT 05/11/2017 FINDINGS: Lower chest: The lung bases are clear. Hepatobiliary: No focal liver abnormality is seen. No gallstones, gallbladder wall thickening, or biliary dilatation. Pancreas: No ductal dilatation or inflammation. Spleen: Normal in size without focal abnormality. Adrenals/Urinary Tract: Normal adrenal glands. No hydronephrosis or perinephric edema. Punctate nonobstructing stones in the left kidney. Both ureters are decompressed without stones along the course. Urinary bladder is partially distended, no bladder stone. Stomach/Bowel: Bowel evaluation is limited in the absence of enteric contrast. Stomach is physiologically distended. No small bowel wall thickening, inflammatory change, or obstruction. The appendix is not well visualized on the current exam. No pericecal inflammatory changes to suggest appendicitis. Moderate colonic stool burden again seen. Mild colonic tortuosity. Vascular/Lymphatic: Normal course and caliber of abdominopelvic vasculature. No bulky adenopathy. Reproductive:  Status post hysterectomy. No adnexal masses. Other: No free air. Small amount of free fluid in can seen in the pelvis. Musculoskeletal: Hemi transitional lumbosacral anatomy with minimal scoliotic curvature of the lower lumbar spine. There are no acute or suspicious osseous abnormalities. IMPRESSION: 1. Nonobstructing left nephrolithiasis. No hydronephrosis or obstructive uropathy. 2. Moderate to large colonic stool burden again seen, suggesting constipation. Electronically Signed   By: Keith Rake M.D.   On: 02/04/2018 06:48    Procedures Procedures (including critical care time)  Medications Ordered in ED Medications  ketorolac (TORADOL) injection 60 mg (60 mg Intramuscular Given 02/04/18 0626)     Initial Impression / Assessment and Plan / ED Course  I have reviewed the triage vital signs and the nursing notes.  Pertinent labs & imaging results that were available during my care of the patient were reviewed by me and considered in my medical decision making (see chart for details).     Patient was given Toradol for her pain and CT renal was done to look for suspected renal stone.  6:15 AM I discussed her CT results which show a stone on her left kidney, not on the right where her pain is located.  She is noted to have a large stool burden.  She states her last bowel movement was 2 days ago.  She denies it being harder like balls.  We discussed treating herself for constipation to see if that helps of her pain.  She also should consider seeing a urologist for her persistent hematuria since 2016.  Final Clinical Impressions(s) / ED Diagnoses   Final diagnoses:  Right sided abdominal pain  Right flank pain  Asymptomatic microscopic hematuria    ED Discharge Orders    None  OTC miralax  Plan discharge  Rolland Porter, MD, Barbette Or, MD 02/04/18 [phone]

## 2018-02-04 NOTE — Discharge Instructions (Addendum)
Get miralax and put one dose or 17 g in 8 ounces of water,  take 1 dose every 30 minutes for 2-3 hours or until you  get good results and then once or twice daily to prevent constipation.  You should consider seeing a urologist to evaluate your "microscopic hematuria" that you have had since 2016. Remember if you get left-sided flank pain that radiates into your left abdomen that you do have a kidney stone in your left kidney.

## 2018-02-04 NOTE — ED Triage Notes (Signed)
Pt complains of flank pain that started Friday however was unable to come in to be seen until today. Does report burning when she urinates but no blood or increase in frequency. Does not have a history of kidney stones.  Has been taking cranberry tablets to try to help s/s

## 2018-08-20 DIAGNOSIS — Z6835 Body mass index (BMI) 35.0-35.9, adult: Secondary | ICD-10-CM | POA: Diagnosis not present

## 2018-08-20 DIAGNOSIS — E669 Obesity, unspecified: Secondary | ICD-10-CM | POA: Diagnosis not present

## 2018-08-20 DIAGNOSIS — Z0000 Encounter for general adult medical examination without abnormal findings: Secondary | ICD-10-CM | POA: Diagnosis not present

## 2018-08-20 DIAGNOSIS — Z01419 Encounter for gynecological examination (general) (routine) without abnormal findings: Secondary | ICD-10-CM | POA: Diagnosis not present

## 2018-08-20 DIAGNOSIS — Z130 Encounter for screening for diseases of the blood and blood-forming organs and certain disorders involving the immune mechanism: Secondary | ICD-10-CM | POA: Diagnosis not present

## 2018-08-20 DIAGNOSIS — Z13220 Encounter for screening for lipoid disorders: Secondary | ICD-10-CM | POA: Diagnosis not present

## 2018-08-20 DIAGNOSIS — Z1231 Encounter for screening mammogram for malignant neoplasm of breast: Secondary | ICD-10-CM | POA: Diagnosis not present

## 2018-08-20 DIAGNOSIS — Z803 Family history of malignant neoplasm of breast: Secondary | ICD-10-CM | POA: Diagnosis not present

## 2018-08-20 DIAGNOSIS — Z1389 Encounter for screening for other disorder: Secondary | ICD-10-CM | POA: Diagnosis not present

## 2018-08-20 DIAGNOSIS — Z1329 Encounter for screening for other suspected endocrine disorder: Secondary | ICD-10-CM | POA: Diagnosis not present

## 2018-08-30 ENCOUNTER — Telehealth: Payer: BLUE CROSS/BLUE SHIELD

## 2018-08-30 DIAGNOSIS — U071 COVID-19: Secondary | ICD-10-CM | POA: Diagnosis not present

## 2018-09-02 DIAGNOSIS — R6889 Other general symptoms and signs: Secondary | ICD-10-CM | POA: Diagnosis not present

## 2018-09-12 ENCOUNTER — Other Ambulatory Visit: Payer: Self-pay

## 2018-09-12 ENCOUNTER — Other Ambulatory Visit: Payer: BLUE CROSS/BLUE SHIELD

## 2018-09-12 DIAGNOSIS — R6889 Other general symptoms and signs: Secondary | ICD-10-CM | POA: Diagnosis not present

## 2018-09-12 DIAGNOSIS — Z20822 Contact with and (suspected) exposure to covid-19: Secondary | ICD-10-CM

## 2018-09-15 LAB — NOVEL CORONAVIRUS, NAA: SARS-CoV-2, NAA: NOT DETECTED

## 2018-09-19 DIAGNOSIS — J029 Acute pharyngitis, unspecified: Secondary | ICD-10-CM | POA: Diagnosis not present

## 2018-09-19 DIAGNOSIS — R6889 Other general symptoms and signs: Secondary | ICD-10-CM | POA: Diagnosis not present

## 2018-09-30 DIAGNOSIS — F329 Major depressive disorder, single episode, unspecified: Secondary | ICD-10-CM | POA: Diagnosis not present

## 2018-09-30 DIAGNOSIS — F4310 Post-traumatic stress disorder, unspecified: Secondary | ICD-10-CM | POA: Diagnosis not present

## 2018-09-30 DIAGNOSIS — R6889 Other general symptoms and signs: Secondary | ICD-10-CM | POA: Diagnosis not present

## 2018-09-30 DIAGNOSIS — F319 Bipolar disorder, unspecified: Secondary | ICD-10-CM | POA: Diagnosis not present

## 2018-09-30 DIAGNOSIS — B379 Candidiasis, unspecified: Secondary | ICD-10-CM | POA: Diagnosis not present

## 2018-10-03 DIAGNOSIS — Z03818 Encounter for observation for suspected exposure to other biological agents ruled out: Secondary | ICD-10-CM | POA: Diagnosis not present

## 2018-10-07 DIAGNOSIS — R6889 Other general symptoms and signs: Secondary | ICD-10-CM | POA: Diagnosis not present

## 2018-10-14 DIAGNOSIS — R6889 Other general symptoms and signs: Secondary | ICD-10-CM | POA: Diagnosis not present

## 2018-10-15 DIAGNOSIS — F4310 Post-traumatic stress disorder, unspecified: Secondary | ICD-10-CM | POA: Diagnosis not present

## 2018-10-15 DIAGNOSIS — F319 Bipolar disorder, unspecified: Secondary | ICD-10-CM | POA: Diagnosis not present

## 2018-10-18 DIAGNOSIS — Z8616 Personal history of COVID-19: Secondary | ICD-10-CM | POA: Insufficient documentation

## 2018-11-06 DIAGNOSIS — K5900 Constipation, unspecified: Secondary | ICD-10-CM | POA: Diagnosis not present

## 2018-11-06 DIAGNOSIS — R109 Unspecified abdominal pain: Secondary | ICD-10-CM | POA: Diagnosis not present

## 2018-11-06 DIAGNOSIS — F319 Bipolar disorder, unspecified: Secondary | ICD-10-CM | POA: Diagnosis not present

## 2018-11-06 DIAGNOSIS — F4310 Post-traumatic stress disorder, unspecified: Secondary | ICD-10-CM | POA: Diagnosis not present

## 2019-07-17 ENCOUNTER — Ambulatory Visit: Payer: BLUE CROSS/BLUE SHIELD | Attending: Internal Medicine

## 2019-07-17 DIAGNOSIS — Z23 Encounter for immunization: Secondary | ICD-10-CM

## 2019-07-17 NOTE — Progress Notes (Signed)
   Covid-19 Vaccination Clinic  Name:  Laurie Thornton    MRN: [id number] DOB: August 31, 1975  07/17/2019  [name] was observed post Covid-19 immunization for 15 minutes without incident. She was provided with Vaccine Information Sheet and instruction to access the V-Safe system.   [name] was instructed to call 911 with any severe reactions post vaccine: Marland Kitchen Difficulty breathing  . Swelling of face and throat  . A fast heartbeat  . A bad rash all over body  . Dizziness and weakness   Immunizations Administered    Name Date Dose VIS Date Route   Pfizer COVID-19 Vaccine 07/17/2019  2:47 PM 0.3 mL 04/16/2018 Intramuscular   Manufacturer: [place]   Lot: [id number]   [place]: [id number]

## 2019-08-11 ENCOUNTER — Ambulatory Visit: Payer: BLUE CROSS/BLUE SHIELD | Attending: Internal Medicine

## 2019-08-11 DIAGNOSIS — Z23 Encounter for immunization: Secondary | ICD-10-CM

## 2019-08-11 NOTE — Progress Notes (Signed)
   Covid-19 Vaccination Clinic  Name:  Laurie Thornton    MRN: [id number] DOB: [birth year]  08/11/2019  [name] was observed post Covid-19 immunization for 15 minutes without incident. She was provided with Vaccine Information Sheet and instruction to access the V-Safe system.   [name] was instructed to call 911 with any severe reactions post vaccine: Marland Kitchen Difficulty breathing  . Swelling of face and throat  . A fast heartbeat  . A bad rash all over body  . Dizziness and weakness   Immunizations Administered    Name Date Dose VIS Date Route   Pfizer COVID-19 Vaccine 08/11/2019  2:18 PM 0.3 mL 04/16/2018 Intramuscular   Manufacturer: [employer], [employer]   Lot: [id number]   [place]: [id number]

## 2020-06-18 ENCOUNTER — Ambulatory Visit: Payer: BLUE CROSS/BLUE SHIELD | Admitting: Urology

## 2020-06-22 NOTE — Progress Notes (Shared)
06/22/2020  10:39 PM   Justice Rocher Rich Reining October 06, 1975 [id number]  Referring provider: Coolidge Breeze, FNP [address] [place],  [place] [address] No chief complaint on file.   HPI: Laurie Thornton is a 45 y.o. female with a personal history of unspecified hematuria (since 2014), who presents today for hematuria and renal stones.  Patient was referred to clinic by Suzzanne Cloud FNP, at the [facility], on 04/27/2020 for chronic hematuria. This is currenlt not being managed and when she previously saw urology about this issue, they opted "not to do anything."  No recent labs, imaging, procedures or surgeries.  Today she reports ***  PMH: Past Medical History:  Diagnosis Date  . Anemia   . Anxiety   . Bipolar disorder ([place])   . Chronic abdominal pain   . Depression     Surgical History: Past Surgical History:  Procedure Laterality Date  . ABDOMINAL HYSTERECTOMY    . [place], 2000    x 2  . KNEE SURGERY Right 1995, 1992   x 2  . LAPAROSCOPIC BILATERAL SALPINGECTOMY Bilateral 09/11/2014   Procedure: LAPAROSCOPIC BILATERAL SALPINGECTOMY;  Surgeon: Azucena Fallen, MD;  Location: [facility];  Service: Gynecology;  Laterality: Bilateral;  . LAPAROSCOPIC LYSIS OF ADHESIONS N/A 09/11/2014   Procedure: LAPAROSCOPIC LYSIS OF ADHESIONS;  Surgeon: Azucena Fallen, MD;  Location: [facility];  Service: Gynecology;  Laterality: N/A;  . MASS EXCISION  2002   right sided abd wall mass excision  . [place]  2012  . ROBOTIC ASSISTED TOTAL HYSTERECTOMY N/A 09/11/2014   Procedure: ROBOTIC ASSISTED TOTAL HYSTERECTOMY;  Surgeon: Azucena Fallen, MD;  Location: [facility];  Service: Gynecology;  Laterality: N/A;  . TUBAL LIGATION  2000    Home Medications:  Allergies as of 06/24/2020      Reactions   Amoxicillin Hives      Medication List       Accurate as of Jun 22, 2020 10:39 PM. If you have any questions, ask your nurse or doctor.        clonazePAM 0.5 MG  tablet Commonly known as: KLONOPIN Take 0.5 mg by mouth daily as needed for anxiety.   dicyclomine 20 MG tablet Commonly known as: BENTYL Take 1 tablet (20 mg total) by mouth 4 (four) times daily -  before meals and at bedtime.   docusate sodium 100 MG capsule Commonly known as: Colace Take 2 capsules (200 mg total) by mouth daily as needed (hard bowel movment).   lamoTRIgine 150 MG tablet Commonly known as: LAMICTAL Take 350 mg by mouth daily.   sertraline 25 MG tablet Commonly known as: ZOLOFT Take 25 mg by mouth daily.   traZODone 50 MG tablet Commonly known as: DESYREL Take 50 mg by mouth at bedtime.       Allergies: Allergies  Allergen Reactions  . Amoxicillin Hives    Family History: Family History  Problem Relation Age of Onset  . Deep vein thrombosis Father     Social History:   reports that she has never smoked. She has never used smokeless tobacco. She reports that she does not drink alcohol and does not use drugs.  ROS: Pertinent ROS in HPI.  Physical Exam: LMP 09/02/2014   Constitutional:  Alert and oriented, No acute distress. HEENT: [place] AT, moist mucus membranes.  Trachea midline, no masses. Cardiovascular: No clubbing, cyanosis, or edema. Respiratory: Normal respiratory effort, no increased work of breathing. ***GI: Abdomen is  soft, non tender, non distended, no abdominal masses. Liver and spleen not palpable.  No hernias appreciated.  Stool sample for occult testing is not indicated. ***GU: Phallus circumcised/uncircumcised without lesions, testes descended bilaterally without masses or tenderness, spermatic cord/epididymis palpably normal bilaterally.  Vasa palpable bilaterally ***Rectal: Prostate is *** grams, no nodules, non-tender, with normal sphincter tone. Skin: No rashes, bruises or suspicious lesions. Neurologic: Grossly intact, no focal deficits, moving all 4 extremities. Psychiatric: Normal mood and affect.  Laboratory Data:  Lab  Results  Component Value Date   CREATININE 0.72 05/10/2017     No results found for: PSA   No results found for: TSH   No results found for: TESTOSTERONE   No results found for: HGBA1C   I have reviewed the labs.  Urinalysis    I have reviewed the labs.  Urine Culture:   I have reviewed the labs.  Pertinent Imaging:   No results found for this or any previous visit.    I have personally reviewed the images and agree with radiologist interpretation.    Assessment & Plan:      Follow Up:  No follow-ups on file.   Jaclyn Shaggy, am acting as a scribe for Dr. Hollice Espy.    [place] [address], [place], [place] [address] [phone]

## 2020-06-24 ENCOUNTER — Ambulatory Visit: Payer: Self-pay | Admitting: Urology

## 2020-07-06 ENCOUNTER — Ambulatory Visit: Payer: Self-pay | Admitting: Urology

## 2020-07-09 ENCOUNTER — Encounter: Payer: Self-pay | Admitting: Urology

## 2020-09-29 DIAGNOSIS — R2242 Localized swelling, mass and lump, left lower limb: Secondary | ICD-10-CM | POA: Insufficient documentation

## 2021-02-15 ENCOUNTER — Encounter (HOSPITAL_COMMUNITY): Payer: Self-pay

## 2021-02-15 ENCOUNTER — Emergency Department (HOSPITAL_COMMUNITY): Payer: 59

## 2021-02-15 ENCOUNTER — Emergency Department (HOSPITAL_COMMUNITY)
Admission: EM | Admit: 2021-02-15 | Discharge: 2021-02-15 | Disposition: A | Discharge status: Home / Self Care | Payer: 59 | Attending: Emergency Medicine | Admitting: Emergency Medicine

## 2021-02-15 DIAGNOSIS — B029 Zoster without complications: Secondary | ICD-10-CM | POA: Insufficient documentation

## 2021-02-15 DIAGNOSIS — R21 Rash and other nonspecific skin eruption: Secondary | ICD-10-CM | POA: Diagnosis present

## 2021-02-15 MED ORDER — SODIUM CHLORIDE (PF) 0.9 % IJ SOLN
INTRAMUSCULAR | Status: AC
  Filled 2021-02-15: qty 50

## 2021-02-15 MED ORDER — IOHEXOL 350 MG/ML SOLN
80.0000 mL | Freq: Once | INTRAVENOUS | Status: AC | PRN
  Administered 2021-02-15: 13:00:00 80 mL via INTRAVENOUS

## 2021-02-15 MED ORDER — GABAPENTIN 100 MG PO CAPS: 100.0000 mg | ORAL_CAPSULE | Freq: Three times a day (TID) | ORAL | 0 refills | Status: DC | PRN

## 2021-02-15 MED ORDER — VALACYCLOVIR HCL 1 G PO TABS: 1000.0000 mg | ORAL_TABLET | Freq: Three times a day (TID) | ORAL | 0 refills | Status: DC

## 2021-02-15 NOTE — ED Triage Notes (Signed)
Pt reports that she went to a hotel on Christmas Eve and then on Christmas morning she had 2 small bites on her face by her mouth. Pt also c/o swelling under her jaw. Pt reports these symptoms are also causing her to have a headache.

## 2021-02-15 NOTE — Discharge Instructions (Addendum)
Take the gabapentin as needed for pain.  If 100 mg seems too strong you can take half.

## 2021-02-15 NOTE — ED Provider Notes (Signed)
[facility] DEPT Provider Note   CSN: [id number] Arrival date & time: 02/15/21  0725     History Chief Complaint  Patient presents with   Facial Pain    Laurie Thornton is a 45 y.o. female.  Patient is a 45 year old female with a history of anemia, bipolar disease who is presenting today with a complaint of painful rash on her face and swelling under her chin.  She reports 4 days ago is when she started noticing the pain and rash on her face.  It affected the left side of her upper lip and she noticed a few bumps on the left side of her cheek.  She reports since the rashes appeared she has been having migraine headaches that have been difficult to treat.  She has had no neck pain but did report some pain going into her ear.  Today she noticed that she had swelling underneath her chin and a painful lump.  She has had no difficulty swallowing or dental issues.  No prior similar symptoms.  The history is provided by the patient.      Past Medical History:  Diagnosis Date   Anemia    Anxiety    Bipolar disorder ([place])    Chronic abdominal pain    Depression     Patient Active Problem List   Diagnosis Date Noted   S/P laparoscopic hysterectomy 09/11/2014   Bipolar disorder, current episode mixed, moderate ([place]) 09/03/2014    Past Surgical History:  Procedure Laterality Date   ABDOMINAL HYSTERECTOMY     CESAREAN SECTION  1997, 2000    x 2   KNEE SURGERY Right 1995, 1992   x 2   LAPAROSCOPIC BILATERAL SALPINGECTOMY Bilateral 09/11/2014   Procedure: LAPAROSCOPIC BILATERAL SALPINGECTOMY;  Surgeon: Azucena Fallen, MD;  Location: [facility];  Service: Gynecology;  Laterality: Bilateral;   LAPAROSCOPIC LYSIS OF ADHESIONS N/A 09/11/2014   Procedure: LAPAROSCOPIC LYSIS OF ADHESIONS;  Surgeon: Azucena Fallen, MD;  Location: [facility];  Service: Gynecology;  Laterality: N/A;   MASS EXCISION  2002   right sided abd wall mass excision   NOVASURE ABLATION  2012    ROBOTIC ASSISTED TOTAL HYSTERECTOMY N/A 09/11/2014   Procedure: ROBOTIC ASSISTED TOTAL HYSTERECTOMY;  Surgeon: Azucena Fallen, MD;  Location: [facility];  Service: Gynecology;  Laterality: N/A;   TUBAL LIGATION  2000     OB History     Gravida  3   Para  2   Term      Preterm      AB  1   Living  2      SAB  1   IAB  0   Ectopic  0   Multiple  0   Live Births              Family History  Problem Relation Age of Onset   Deep vein thrombosis Father     Social History   Tobacco Use   Smoking status: Never   Smokeless tobacco: Never  Vaping Use   Vaping Use: Never used  Substance Use Topics   Alcohol use: No   Drug use: No    Home Medications Prior to Admission medications   Medication Sig Start Date End Date Taking? Authorizing Provider  clonazePAM (KLONOPIN) 0.5 MG tablet Take 0.5 mg by mouth daily as needed for anxiety.    [provider]  dicyclomine (BENTYL) 20 MG tablet Take 1 tablet (20 mg total) by mouth 4 (  four) times daily -  before meals and at bedtime. 05/10/17   Lorin Picket, PA-C  docusate sodium (COLACE) 100 MG capsule Take 2 capsules (200 mg total) by mouth daily as needed (hard bowel movment). 10/12/14   Artelia Laroche, CNM  lamoTRIgine (LAMICTAL) 150 MG tablet Take 350 mg by mouth daily.     [provider]  sertraline (ZOLOFT) 25 MG tablet Take 25 mg by mouth daily.    [provider]  traZODone (DESYREL) 50 MG tablet Take 50 mg by mouth at bedtime.    [provider]    Allergies    Amoxicillin  Review of Systems   Review of Systems  All other systems reviewed and are negative.  Physical Exam Updated Vital Signs BP (!) 135/98 (BP Location: Right Arm)    Pulse 79    Temp 98.3 F (36.8 C) (Oral)    Resp 16    LMP 09/02/2014    SpO2 100%   Physical Exam Vitals and nursing note reviewed.  Constitutional:      General: She is not in acute distress.    Appearance: She is well-developed.  HENT:      Head: Normocephalic and atraumatic.      Right Ear: Tympanic membrane normal.     Left Ear: Tympanic membrane normal.  Eyes:     Pupils: Pupils are equal, round, and reactive to light.  Cardiovascular:     Rate and Rhythm: Normal rate and regular rhythm.     Heart sounds: Normal heart sounds. No murmur heard.   No friction rub.  Pulmonary:     Effort: Pulmonary effort is normal.     Breath sounds: Normal breath sounds. No wheezing or rales.  Musculoskeletal:        General: No tenderness. Normal range of motion.     Cervical back: Neck supple. No tenderness.     Comments: No edema  Lymphadenopathy:     Cervical: No cervical adenopathy.  Skin:    General: Skin is warm and dry.     Findings: No rash.  Neurological:     Mental Status: She is alert and oriented to person, place, and time. Mental status is at baseline.     Cranial Nerves: No cranial nerve deficit.  Psychiatric:        Behavior: Behavior normal.    ED Results / Procedures / Treatments   Labs (all labs ordered are listed, but only abnormal results are displayed) Labs Reviewed - No data to display  EKG None  Radiology CT Soft Tissue Neck W Contrast  Result Date: 02/15/2021 CLINICAL DATA:  Soft tissue swelling. Infection suspected. Patient reports small bites next to her mouth. Swelling under the jaw. EXAM: CT NECK WITH CONTRAST TECHNIQUE: Multidetector CT imaging of the neck was performed using the standard protocol following the bolus administration of intravenous contrast. CONTRAST:  104mL OMNIPAQUE IOHEXOL 350 MG/ML SOLN COMPARISON:  None FINDINGS: Pharynx and larynx: No focal mucosal or submucosal lesions are present. Nasopharynx is clear. Soft palate tongue base are within normal limits. Vallecula and epiglottis are within normal limits. Aryepiglottic folds and piriform sinuses are clear. Vocal cords are midline and symmetric. Trachea is clear. Salivary glands: The submandibular and parotid glands and ducts  are within normal limits. Thyroid: Normal. Lymph nodes: Submental lymph node measures 1.6 x 1.1 x 1.1 cm. This is adjacent to the area marked. The smaller rounded node is present just inferior to the larger node, measuring up to  6 mm. The 1 on Left greater than right submandibular lymph nodes are noted. None are enlarged. Multiple small level 2 lymph nodes are also present bilaterally. The right jugulodigastric lymph node measures 17 mm. The largest left-sided node measures 22 mm. No necrotic nodes are present. No other enlarged nodes are present in the neck. Vascular: Unremarkable Limited intracranial: Within normal limits. Visualized orbits: The globes and orbits are within normal limits. Mastoids and visualized paranasal sinuses: The paranasal sinuses and mastoid air cells are clear. Skeleton: Vertebral body heights and alignment are normal. No focal osseous lesions are present. Upper chest: The lung apices are clear. Thoracic inlet is within normal limits. Other: IMPRESSION: 1. 1.6 x 1.1 x 1.1 cm submental lymph node is adjacent to the area marked. This may be related to a superficial infection. 2. Additional rounded node just inferior to the larger node measures up to 6 mm. 3. Multiple small level 2 lymph nodes bilaterally are also present. None are enlarged. No necrotic nodes are present. These appear reactive. 4. No other enlarged nodes are present in the neck. 5. No primary lesion or abscess. Electronically Signed   By: San Morelle M.D.   On: 02/15/2021 13:20    Procedures Procedures   Medications Ordered in ED Medications  sodium chloride (PF) 0.9 % injection (has no administration in time range)  iohexol (OMNIPAQUE) 350 MG/ML injection 80 mL (80 mLs Intravenous Contrast Given 02/15/21 1256)    ED Course  I have reviewed the triage vital signs and the nursing notes.  Pertinent labs & imaging results that were available during my care of the patient were reviewed by me and considered  in my medical decision making (see chart for details).    MDM Rules/Calculators/A&P                         Patient presenting today with symptoms most classic for shingles in the V2 dermatome distribution.  No evidence of Ramsay Hunt no lesions on the ear or nose.  However patient does have swelling and tenderness in her submental area however a very firm knot palpated does not feel like a classic lymph node.  She has no swelling or discomfort inside her mouth or evidence of poor dentition.  No evidence of blocked salivary ducts.  CT to further evaluate.  1:34 PM CT consistent with lymph nodes but no other abnormalities.  Will treat for shingles and f/u with pcp.  MDM   Amount and/or Complexity of Data Reviewed Tests in the radiology section of CPT: ordered and reviewed Independent visualization of images, tracings, or specimens: yes        Final Clinical Impression(s) / ED Diagnoses Final diagnoses:  Herpes zoster without complication    Rx / DC Orders ED Discharge Orders          Ordered    valACYclovir (VALTREX) 1000 MG tablet  3 times daily        02/15/21 1336    gabapentin (NEURONTIN) 100 MG capsule  3 times daily PRN        02/15/21 1336             Blanchie Dessert, MD 02/15/21 1338

## 2021-07-31 ENCOUNTER — Encounter: Payer: Self-pay | Admitting: Emergency Medicine

## 2021-07-31 ENCOUNTER — Ambulatory Visit
Admission: EM | Admit: 2021-07-31 | Discharge: 2021-07-31 | Disposition: A | Discharge status: Home / Self Care | Payer: 59 | Attending: Physician Assistant | Admitting: Physician Assistant

## 2021-07-31 DIAGNOSIS — N3001 Acute cystitis with hematuria: Secondary | ICD-10-CM | POA: Insufficient documentation

## 2021-07-31 DIAGNOSIS — N760 Acute vaginitis: Secondary | ICD-10-CM | POA: Diagnosis present

## 2021-07-31 DIAGNOSIS — B9689 Other specified bacterial agents as the cause of diseases classified elsewhere: Secondary | ICD-10-CM | POA: Insufficient documentation

## 2021-07-31 LAB — URINALYSIS, ROUTINE W REFLEX MICROSCOPIC
Glucose, UA: NEGATIVE mg/dL
Nitrite: POSITIVE — AB
Protein, ur: 100 mg/dL — AB
Specific Gravity, Urine: 1.025 (ref 1.005–1.030)
pH: 6 (ref 5.0–8.0)

## 2021-07-31 LAB — WET PREP, GENITAL
Sperm: NONE SEEN
Trich, Wet Prep: NONE SEEN
WBC, Wet Prep HPF POC: <10 (ref <10)
Yeast Wet Prep HPF POC: NONE SEEN

## 2021-07-31 LAB — URINALYSIS, MICROSCOPIC (REFLEX)
RBC / HPF: >50 RBC/hpf (ref 0–5)
WBC, UA: >50 WBC/hpf (ref 0–5)

## 2021-07-31 MED ORDER — CIPROFLOXACIN HCL 500 MG PO TABS: 500.0000 mg | ORAL_TABLET | Freq: Two times a day (BID) | ORAL | 0 refills | Status: AC

## 2021-07-31 MED ORDER — FLUCONAZOLE 200 MG PO TABS
ORAL_TABLET | ORAL | 0 refills | Status: DC
Start: 2021-07-31 — End: 2021-10-07

## 2021-07-31 MED ORDER — METRONIDAZOLE 500 MG PO TABS: 500.0000 mg | ORAL_TABLET | Freq: Two times a day (BID) | ORAL | 0 refills | Status: AC

## 2021-07-31 NOTE — ED Triage Notes (Signed)
Patient c/o burning when urinating, lower back and abdominal pain, and vaginal itching that started last Thursday.  Patient reports chills.

## 2021-07-31 NOTE — Discharge Instructions (Signed)

## 2021-07-31 NOTE — ED Provider Notes (Signed)
[facility]    CSN: [id number] Arrival date & time: 07/31/21  5027      History   Chief Complaint Chief Complaint  Patient presents with   Abdominal Pain   Back Pain   Vaginal Itching   Dysuria    HPI Laurie HAJJAR is a 46 y.o. female presenting for dysuria, urinary frequency and urgency as well as lower back pain and lower abdominal cramping for the past 3 days.  Patient reports that it seemed to get better until this morning when she woke up and use the bathroom.  Reports significantly worsening dysuria.  Also reports vaginal itching and irritation but no discharge.  She has had some chills but has not recorded a temperature.  Patient concerned about potential UTI or vaginal infection.  Has been trying AZO over-the-counter for symptoms which was helping until recently.  Has not taken any of that medicine today.  No other complaints.  HPI  Past Medical History:  Diagnosis Date   Anemia    Anxiety    Bipolar disorder ([place])    Chronic abdominal pain    Depression     Patient Active Problem List   Diagnosis Date Noted   S/P laparoscopic hysterectomy 09/11/2014   Bipolar disorder, current episode mixed, moderate ([place]) 09/03/2014    Past Surgical History:  Procedure Laterality Date   ABDOMINAL HYSTERECTOMY     CESAREAN SECTION  1997, 2000    x 2   KNEE SURGERY Right 1995, 1992   x 2   LAPAROSCOPIC BILATERAL SALPINGECTOMY Bilateral 09/11/2014   Procedure: LAPAROSCOPIC BILATERAL SALPINGECTOMY;  Surgeon: Azucena Fallen, MD;  Location: [facility];  Service: Gynecology;  Laterality: Bilateral;   LAPAROSCOPIC LYSIS OF ADHESIONS N/A 09/11/2014   Procedure: LAPAROSCOPIC LYSIS OF ADHESIONS;  Surgeon: Azucena Fallen, MD;  Location: [facility];  Service: Gynecology;  Laterality: N/A;   MASS EXCISION  2002   right sided abd wall mass excision   NOVASURE ABLATION  2012   ROBOTIC ASSISTED TOTAL HYSTERECTOMY N/A 09/11/2014   Procedure: ROBOTIC ASSISTED TOTAL HYSTERECTOMY;  Surgeon:  Azucena Fallen, MD;  Location: [facility];  Service: Gynecology;  Laterality: N/A;   TUBAL LIGATION  2000    OB History     Gravida  3   Para  2   Term      Preterm      AB  1   Living  2      SAB  1   IAB  0   Ectopic  0   Multiple  0   Live Births               Home Medications    Prior to Admission medications   Medication Sig Start Date End Date Taking? Authorizing Provider  ciprofloxacin (CIPRO) 500 MG tablet Take 1 tablet (500 mg total) by mouth every 12 (twelve) hours for 7 days. 07/31/21 08/07/21 Yes Laurene Footman B, PA-C  fluconazole (DIFLUCAN) 200 MG tablet Take 1 tab p.o. every 72 hours for yeast infection 07/31/21  Yes Danton Clap, PA-C  metroNIDAZOLE (FLAGYL) 500 MG tablet Take 1 tablet (500 mg total) by mouth 2 (two) times daily for 7 days. 07/31/21 08/07/21 Yes Danton Clap, PA-C  clonazePAM (KLONOPIN) 0.5 MG tablet Take 0.5 mg by mouth daily as needed for anxiety.    [provider]  dicyclomine (BENTYL) 20 MG tablet Take 1 tablet (20 mg total) by mouth 4 (four) times daily -  before meals  and at bedtime. 05/10/17   Lorin Picket, PA-C  docusate sodium (COLACE) 100 MG capsule Take 2 capsules (200 mg total) by mouth daily as needed (hard bowel movment). 10/12/14   Artelia Laroche, CNM  gabapentin (NEURONTIN) 100 MG capsule Take 1 capsule (100 mg total) by mouth 3 (three) times daily as needed (for pain). 02/15/21   Blanchie Dessert, MD  lamoTRIgine (LAMICTAL) 150 MG tablet Take 350 mg by mouth daily.     [provider]  sertraline (ZOLOFT) 25 MG tablet Take 25 mg by mouth daily.    [provider]  traZODone (DESYREL) 50 MG tablet Take 50 mg by mouth at bedtime.    [provider]  valACYclovir (VALTREX) 1000 MG tablet Take 1 tablet (1,000 mg total) by mouth 3 (three) times daily. 02/15/21   Blanchie Dessert, MD    Family History Family History  Problem Relation Age of Onset   Deep vein thrombosis Father      Social History Social History   Tobacco Use   Smoking status: Never   Smokeless tobacco: Never  Vaping Use   Vaping Use: Never used  Substance Use Topics   Alcohol use: No   Drug use: No     Allergies   Amoxicillin   Review of Systems Review of Systems  Constitutional:  Negative for chills, fatigue and fever.  Gastrointestinal:  Positive for abdominal pain. Negative for diarrhea, nausea and vomiting.  Genitourinary:  Positive for dysuria, flank pain, frequency and urgency. Negative for decreased urine volume, hematuria, pelvic pain, vaginal bleeding, vaginal discharge and vaginal pain.       +vaginal itching  Musculoskeletal:  Positive for back pain.  Skin:  Negative for rash.     Physical Exam Triage Vital Signs ED Triage Vitals  Enc Vitals Group     BP      Pulse      Resp      Temp      Temp src      SpO2      Weight      Height      Head Circumference      Peak Flow      Pain Score      Pain Loc      Pain Edu?      Excl. in [place]?    No data found.  Updated Vital Signs BP 104/78 (BP Location: Left Arm)   Pulse 91   Temp 98.1 F (36.7 C) (Oral)   Resp 14   Ht '5\' 8"'$  (1.727 m)   Wt 180 lb (81.6 kg)   LMP 09/02/2014   SpO2 98%   BMI 27.37 kg/m   Physical Exam Vitals and nursing note reviewed.  Constitutional:      General: She is not in acute distress.    Appearance: Normal appearance. She is well-developed. She is not ill-appearing or toxic-appearing.  HENT:     Head: Normocephalic and atraumatic.  Eyes:     General: No scleral icterus.       Right eye: No discharge.        Left eye: No discharge.     Conjunctiva/sclera: Conjunctivae normal.  Cardiovascular:     Rate and Rhythm: Normal rate and regular rhythm.     Heart sounds: Normal heart sounds.  Pulmonary:     Effort: Pulmonary effort is normal. No respiratory distress.     Breath sounds: Normal breath sounds.  Abdominal:     Palpations:  Abdomen is soft.     Tenderness: There  is abdominal tenderness (generalized). There is right CVA tenderness (subjective) and left CVA tenderness (subjective).  Musculoskeletal:     Cervical back: Neck supple.  Skin:    General: Skin is dry.  Neurological:     General: No focal deficit present.     Mental Status: She is alert. Mental status is at baseline.     Motor: No weakness.     Gait: Gait normal.  Psychiatric:        Mood and Affect: Mood normal.        Behavior: Behavior normal.        Thought Content: Thought content normal.      UC Treatments / Results  Labs (all labs ordered are listed, but only abnormal results are displayed) Labs Reviewed  WET PREP, GENITAL - Abnormal; Notable for the following components:      Result Value   Clue Cells Wet Prep HPF POC PRESENT (*)    All other components within normal limits  URINALYSIS, ROUTINE W REFLEX MICROSCOPIC - Abnormal; Notable for the following components:   Color, Urine AMBER (*)    APPearance CLOUDY (*)    Hgb urine dipstick LARGE (*)    Bilirubin Urine SMALL (*)    Ketones, ur TRACE (*)    Protein, ur 100 (*)    Nitrite POSITIVE (*)    Leukocytes,Ua MODERATE (*)    All other components within normal limits  URINALYSIS, MICROSCOPIC (REFLEX) - Abnormal; Notable for the following components:   Bacteria, UA MANY (*)    All other components within normal limits  URINE CULTURE    EKG   Radiology No results found.  Procedures Procedures (including critical care time)  Medications Ordered in UC Medications - No data to display  Initial Impression / Assessment and Plan / UC Course  I have reviewed the triage vital signs and the nursing notes.  Pertinent labs & imaging results that were available during my care of the patient were reviewed by me and considered in my medical decision making (see chart for details).  46 year old female presenting for 3-day history of dysuria, urine frequency and urgency, lower back pain and lower abdominal cramping as  well as vaginal itching and irritation.  Vitals normal stable.  Patient overall well-appearing.  She does have generalized abdominal tenderness but abdomen is soft.  Subjective mild bilateral CVA tenderness.  Chest clear to auscultation heart regular rate and rhythm.  UA shows large blood, small bili, trace ketones, positive nitrites and moderate leukocytes.  We will send urine for culture and treat for UTI, could be a sending UTI given her CVA tenderness.  Will cover with Cipro at this time.  Wet prep positive for clue cells.  Sent metronidazole to pharmacy.  Also sent Diflucan in case she were to develop yeast infection.  She says that is happened in the past.  Increase rest and fluids.  Reviewed return and ER precautions.   Final Clinical Impressions(s) / UC Diagnoses   Final diagnoses:  Acute cystitis with hematuria  Bacterial vaginitis  Vaginitis and vulvovaginitis     Discharge Instructions      UTI: Based on either symptoms or urinalysis, you may have a urinary tract infection. We will send the urine for culture and call with results in a few days. Begin antibiotics at this time. Your symptoms should be much improved over the next 2-3 days. Increase rest and fluid intake. If for  some reason symptoms are worsening or not improving after a couple of days or the urine culture determines the antibiotics you are taking will not treat the infection, the antibiotics may be changed. Return or go to ER for fever, back pain, worsening urinary pain, discharge, increased blood in urine. May take Tylenol or Motrin OTC for pain relief or consider AZO if no contraindications      ED Prescriptions     Medication Sig Dispense Auth. Provider   fluconazole (DIFLUCAN) 200 MG tablet Take 1 tab p.o. every 72 hours for yeast infection 3 tablet Laurene Footman B, PA-C   metroNIDAZOLE (FLAGYL) 500 MG tablet Take 1 tablet (500 mg total) by mouth 2 (two) times daily for 7 days. 14 tablet Laurene Footman B,  PA-C   ciprofloxacin (CIPRO) 500 MG tablet Take 1 tablet (500 mg total) by mouth every 12 (twelve) hours for 7 days. 14 tablet Gretta Cool      PDMP not reviewed this encounter.   Danton Clap, PA-C 07/31/21 [phone]

## 2021-08-02 LAB — URINE CULTURE: Culture: >=100000 — AB

## 2021-09-29 ENCOUNTER — Emergency Department
Admission: EM | Admit: 2021-09-29 | Discharge: 2021-09-29 | Disposition: A | Discharge status: Home / Self Care | Payer: 59 | Attending: Emergency Medicine | Admitting: Emergency Medicine

## 2021-09-29 ENCOUNTER — Emergency Department: Payer: 59

## 2021-09-29 ENCOUNTER — Other Ambulatory Visit: Payer: Self-pay

## 2021-09-29 DIAGNOSIS — R002 Palpitations: Secondary | ICD-10-CM | POA: Diagnosis not present

## 2021-09-29 DIAGNOSIS — R079 Chest pain, unspecified: Secondary | ICD-10-CM | POA: Insufficient documentation

## 2021-09-29 DIAGNOSIS — R0602 Shortness of breath: Secondary | ICD-10-CM | POA: Insufficient documentation

## 2021-09-29 DIAGNOSIS — R42 Dizziness and giddiness: Secondary | ICD-10-CM | POA: Diagnosis present

## 2021-09-29 LAB — COMPREHENSIVE METABOLIC PANEL
ALT: 11 U/L (ref 0–44)
AST: 17 U/L (ref 15–41)
Albumin: 4 g/dL (ref 3.5–5.0)
Alkaline Phosphatase: 42 U/L (ref 38–126)
Anion gap: 7 (ref 5–15)
BUN: 11 mg/dL (ref 6–20)
CO2: 24 mmol/L (ref 22–32)
Calcium: 9.8 mg/dL (ref 8.9–10.3)
Chloride: 107 mmol/L (ref 98–111)
Creatinine, Ser: 0.85 mg/dL (ref 0.44–1.00)
GFR, Estimated: >60 mL/min (ref >60)
Glucose, Bld: 77 mg/dL (ref 70–99)
Potassium: 3.4 mmol/L — ABNORMAL LOW (ref 3.5–5.1)
Sodium: 138 mmol/L (ref 135–145)
Total Bilirubin: 0.7 mg/dL (ref 0.3–1.2)
Total Protein: 8 g/dL (ref 6.5–8.1)

## 2021-09-29 LAB — CBC WITH DIFFERENTIAL/PLATELET
Abs Immature Granulocytes: 0.01 10*3/uL (ref 0.00–0.07)
Basophils Absolute: 0 10*3/uL (ref 0.0–0.1)
Basophils Relative: 0 %
Eosinophils Absolute: 0 10*3/uL (ref 0.0–0.5)
Eosinophils Relative: 1 %
HCT: 39.5 % (ref 36.0–46.0)
Hemoglobin: 12.9 g/dL (ref 12.0–15.0)
Immature Granulocytes: 0 %
Lymphocytes Relative: 43 %
Lymphs Abs: 2.2 10*3/uL (ref 0.7–4.0)
MCH: 30.6 pg (ref 26.0–34.0)
MCHC: 32.7 g/dL (ref 30.0–36.0)
MCV: 93.6 fL (ref 80.0–100.0)
Monocytes Absolute: 0.5 10*3/uL (ref 0.1–1.0)
Monocytes Relative: 10 %
Neutro Abs: 2.4 10*3/uL (ref 1.7–7.7)
Neutrophils Relative %: 46 %
Platelets: 333 10*3/uL (ref 150–400)
RBC: 4.22 MIL/uL (ref 3.87–5.11)
RDW: 12.7 % (ref 11.5–15.5)
WBC: 5.1 10*3/uL (ref 4.0–10.5)
nRBC: 0 % (ref 0.0–0.2)

## 2021-09-29 LAB — TROPONIN I (HIGH SENSITIVITY)
Troponin I (High Sensitivity): 6 ng/L (ref <18)
Troponin I (High Sensitivity): 24 ng/L — ABNORMAL HIGH (ref <18)

## 2021-09-29 MED ORDER — SODIUM CHLORIDE 0.9 % IV BOLUS
1000.0000 mL | Freq: Once | INTRAVENOUS | Status: AC
  Administered 2021-09-29: 1000 mL via INTRAVENOUS

## 2021-09-29 NOTE — ED Triage Notes (Signed)
PT arrives from home with dizziness after shower. PT was getting ready for work, and felt jittery, felt their heart racing and had some vision changes. PT stating this lasted for 10 minutes.   Pt been on Wegovy since April. PT was recently on cipro for UTI and finished antibiotics yesterday.

## 2021-09-29 NOTE — ED Notes (Signed)
Patient discharged w/ AVS and voices understanding to continue hydrating with water at home. Understands to call [facility] with number provided if she does not hear from office within 72 hours. Ambulatory upon discharge, no concerns noted at this time.

## 2021-09-29 NOTE — ED Triage Notes (Signed)
First Nurse: Pt here via [facility] with dizziness. Pt was in the shower when the dizziness hit. Pt is on a weight loss shot which she took this morning. Pt denies pain.   120/81 105 100% RA 94-cbg 97.5

## 2021-09-29 NOTE — ED Provider Notes (Signed)
[facility] Provider Note   Event Date/Time   First MD Initiated Contact with Patient 09/29/21 [phone]     (approximate) History  Dizziness  HPI Laurie Thornton is a 46 y.o. female with no stated past history who presents for an episode of lightheadedness and palpitations that occurred approximately 30 minutes prior to arrival.  Patient arrives via EMS stated that patient had significant orthostatic lightheadedness upon their arrival.  Patient was able to tolerate standing for blood pressure.  However, EMS states patient initial sitting blood pressure was not abnormal.  Patient denies any symptoms similar to this in the past.  Patient denies any complaints at this time.  Patient states that this episode lasted approximately 15 minutes and resolved spontaneously ROS: Patient currently denies any vision changes, tinnitus, difficulty speaking, facial droop, sore throat, chest pain, shortness of breath, abdominal pain, nausea/vomiting/diarrhea, dysuria, or weakness/numbness/paresthesias in any extremity   Physical Exam  Triage Vital Signs: ED Triage Vitals  Enc Vitals Group     BP 09/29/21 0919 (!) 118/92     Pulse Rate 09/29/21 0919 98     Resp 09/29/21 0919 16     Temp 09/29/21 0919 98 F (36.7 C)     Temp Source 09/29/21 0919 Oral     SpO2 09/29/21 0919 98 %     Weight 09/29/21 0938 163 lb (73.9 kg)     Height --      Head Circumference --      Peak Flow --      Pain Score 09/29/21 0938 0     Pain Loc --      Pain Edu? --      Excl. in [place]? --    Most recent vital signs: Vitals:   09/29/21 0919 09/29/21 1306  BP: (!) 118/92 127/84  Pulse: 98 81  Resp: 16 18  Temp: 98 F (36.7 C) 98.6 F (37 C)  SpO2: 98% 100%   General: Awake, oriented x4. CV:  Good peripheral perfusion.  No obvious murmurs Resp:  Normal effort.  Clear to auscultation bilaterally Abd:  No distention.  Other:  Middle-aged African-American female laying in bed in no acute distress ED  Results / Procedures / Treatments  Labs (all labs ordered are listed, but only abnormal results are displayed) Labs Reviewed  COMPREHENSIVE METABOLIC PANEL - Abnormal; Notable for the following components:      Result Value   Potassium 3.4 (*)    All other components within normal limits  TROPONIN I (HIGH SENSITIVITY) - Abnormal; Notable for the following components:   Troponin I (High Sensitivity) 24 (*)    All other components within normal limits  CBC WITH DIFFERENTIAL/PLATELET  TROPONIN I (HIGH SENSITIVITY)   EKG ED ECG REPORT I, Naaman Plummer, the attending physician, personally viewed and interpreted this ECG. Date: 09/29/2021 EKG Time: 0923 Rate: 95 Rhythm: normal sinus rhythm QRS Axis: normal Intervals: normal ST/T Wave abnormalities: normal Narrative Interpretation: no evidence of acute ischemia RADIOLOGY ED MD interpretation: One-view portable chest x-ray interpreted by me shows no evidence of acute abnormalities including no pneumonia, pneumothorax, or widened mediastinum -Agree with radiology assessment Official radiology report(s): DG Chest Port 1 View  Result Date: 09/29/2021 CLINICAL DATA:  Presyncope. EXAM: PORTABLE CHEST 1 VIEW COMPARISON:  Jun 26, 2013. FINDINGS: The heart size and mediastinal contours are within normal limits. Both lungs are clear. The visualized skeletal structures are unremarkable. IMPRESSION: No active disease. Electronically Signed   By: Jeneen Rinks  Murlean Caller M.D.   On: 09/29/2021 11:09   PROCEDURES: Critical Care performed: No .1-3 Lead EKG Interpretation  Performed by: Naaman Plummer, MD Authorized by: Naaman Plummer, MD     Interpretation: normal     ECG rate:  82   ECG rate assessment: normal     Rhythm: sinus rhythm     Ectopy: none     Conduction: normal    MEDICATIONS ORDERED IN ED: Medications  sodium chloride 0.9 % bolus 1,000 mL (0 mLs Intravenous Stopped 09/29/21 1306)   IMPRESSION / MDM / ASSESSMENT AND PLAN / ED COURSE   I reviewed the triage vital signs and the nursing notes.                             The patient is on the cardiac monitor to evaluate for evidence of arrhythmia and/or significant heart rate changes. Patient's presentation is most consistent with acute presentation with potential threat to life or bodily function. 46 year old female presents with palpitations. EKG: No STEMI and no evidence of Brugadas sign, delta wave, epsilon wave, significantly prolonged QTc, or malignant arrhythmia. Based on H&P and testing, this patient appears to be low risk for emergent causes of palpitations such as, but not limited to, a malignant cardiac arrhythmia, ACS, pulmonary embolism, thyrotoxicosis, PNA, PTX.  The patient has been given strict return precautions and understands the need for further outpatient testing and treatment.   FINAL CLINICAL IMPRESSION(S) / ED DIAGNOSES   Final diagnoses:  Lightheadedness  Palpitations  Shortness of breath  Chest pain, unspecified type   Rx / DC Orders   ED Discharge Orders          Ordered    Ambulatory referral to Cardiology  Status:  Canceled       Comments: If you have not heard from the Cardiology office within the next 72 hours please call [phone].   09/29/21 1218    Ambulatory referral to Cardiology       Comments: If you have not heard from the Cardiology office within the next 72 hours please call [phone].   09/29/21 1220           Note:  This document was prepared using Dragon voice recognition software and may include unintentional dictation errors.   Naaman Plummer, MD 09/29/21 1556

## 2021-10-07 ENCOUNTER — Ambulatory Visit: Payer: 59 | Admitting: Internal Medicine

## 2021-10-07 ENCOUNTER — Encounter: Payer: Self-pay | Admitting: Internal Medicine

## 2021-10-07 ENCOUNTER — Ambulatory Visit (INDEPENDENT_AMBULATORY_CARE_PROVIDER_SITE_OTHER): Payer: 59

## 2021-10-07 VITALS — BP 108/80 | HR 102 | Ht 66.5 in | Wt 170.0 lb

## 2021-10-07 DIAGNOSIS — R072 Precordial pain: Secondary | ICD-10-CM | POA: Diagnosis not present

## 2021-10-07 DIAGNOSIS — R778 Other specified abnormalities of plasma proteins: Secondary | ICD-10-CM | POA: Diagnosis not present

## 2021-10-07 DIAGNOSIS — R7989 Other specified abnormal findings of blood chemistry: Secondary | ICD-10-CM

## 2021-10-07 DIAGNOSIS — R002 Palpitations: Secondary | ICD-10-CM

## 2021-10-07 MED ORDER — METOPROLOL TARTRATE 100 MG PO TABS: 100.0000 mg | ORAL_TABLET | Freq: Once | ORAL | 0 refills | Status: AC

## 2021-10-07 NOTE — Progress Notes (Signed)
Cardiology Office Note:    Date:  10/07/2021   ID:  Laurie Thornton, DOB 1975-03-04, MRN [id number]  PCP:  [facility] Cardiologist:  Lenna Sciara, MD Referring MD: [facility]*   Chief Complaint/Reason for Referral:  Palpitations and chest pain  ASSESSMENT:    1. Palpitations   2. Troponin I above reference range   3. Precordial pain     PLAN:    In order of problems listed above: 1.  Palpitations:  We will obtain monitor and echocardiogram.  We will keep follow-up open-ended depending on the results of our collection of testing. 2.  Elevated troponin: Her second high sensitivty troponin was mildly elevated.  This may represent type II myocardial infarction of unknown etiology.  I will obtain a coronary CTA to evaluate further; see discussion below. 3.  Chest pain: Given the patient's chest pain at rest and occasionally with exertion obtain a coronary CTA to evaluate further.  We will obtain a coronary CTA and echocardiogram to evaluate further.  If the patient has mild obstructive coronary artery disease, they will require a statin (with goal LDL < 70) and aspirin, if they have high-grade disease we will need to consider optimal medical therapy and if symptoms are refractory to medical therapy, then a cardiac catheterization with possible PCI will be pursued to alleviate symptoms.  If they have high risk disease we will proceed directly to cardiac catheterization.     Dispo:  Return if symptoms worsen or fail to improve.      Medication Adjustments/Labs and Tests Ordered: Current medicines are reviewed at length with the patient today.  Concerns regarding medicines are outlined above.  The following changes have been made:  no change   Labs/tests ordered: Orders Placed This Encounter  Procedures   CT CORONARY MORPH W/CTA COR W/SCORE W/CA W/CM &/OR WO/CM   LONG TERM MONITOR (3-14 DAYS)   ECHOCARDIOGRAM  COMPLETE    Medication Changes: Meds ordered this encounter  Medications   metoprolol tartrate (LOPRESSOR) 100 MG tablet    Sig: Take 1 tablet (100 mg total) by mouth once for 1 dose. Take 90-120 minutes prior to scan.    Dispense:  1 tablet    Refill:  0     Current medicines are reviewed at length with the patient today.  The patient does not have concerns regarding medicines.   History of Present Illness:    FOCUSED PROBLEM LIST:   Depression with previous suicidal ideation; no longer actively depressed  The patient is a 46 y.o. female with the indicated medical history here for emergency room follow-up for palpitations.  The patient was seen in emergency department recently with an episode of lightheadedness and palpitations.  Her labs were remarkable for a second troponin being mildly elevated.  Her EKG is however reassuring.  Her chest x-ray showed no acute cardiopulmonary disease.  She was ultimately discharged home.  The patient tells me that she was in her shower on the day of her presentation.  She felt a rapid heartbeat.  She had to sit down to compose herself.  This led to her presentation in the emergency department.  This happened again this morning.  She finds that when she is under stress she will develop palpitations and chest discomfort.  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  She denies any exertional shortness of breath,  presyncope, syncope, severe bleeding, paroxysmal nocturnal dyspnea, orthopnea.  She does not smoke, drink, or partake in illicit substances.   Current Medications: Current Meds  Medication Sig   cholecalciferol (VITAMIN D3) 25 MCG (1000 UNIT) tablet Take 1,000 Units by mouth 2 (two) times daily.   metoprolol tartrate (LOPRESSOR) 100 MG tablet Take 1 tablet (100 mg total) by mouth once for 1 dose. Take 90-120 minutes prior to scan.     Allergies:    Amoxicillin   Social History:   Social  History   Tobacco Use   Smoking status: Never   Smokeless tobacco: Never  Vaping Use   Vaping Use: Never used  Substance Use Topics   Alcohol use: No   Drug use: No     Family Hx: Family History  Problem Relation Age of Onset   Deep vein thrombosis Father      Review of Systems:   Please see the history of present illness.    All other systems reviewed and are negative.     EKGs/Labs/Other Test Reviewed:    EKG:  EKG performed September 29, 2021 that I personally reviewed demonstrates sinus rhythm with nonspecific ST and T wave changes.  Prior CV studies: None available  Other studies Reviewed: Review of the additional studies/records demonstrates: CT abdomen pelvis without aortic atherosclerosis  Recent Labs: 09/29/2021: ALT 11; BUN 11; Creatinine, Ser 0.85; Hemoglobin 12.9; Platelets 333; Potassium 3.4; Sodium 138   Recent Lipid Panel No results found for: "CHOL", "TRIG", "HDL", "LDLCALC", "LDLDIRECT"  Risk Assessment/Calculations:                Physical Exam:    VS:  BP 108/80   Pulse (!) 102   Ht 5' 6.5" (1.689 m)   Wt 170 lb (77.1 kg)   LMP 09/02/2014   BMI 27.03 kg/m    Wt Readings from Last 3 Encounters:  10/07/21 170 lb (77.1 kg)  09/29/21 163 lb (73.9 kg)  07/31/21 180 lb (81.6 kg)    GENERAL:  No apparent distress, AOx3 HEENT:  No carotid bruits, +2 carotid impulses, no scleral icterus CAR: RRR no murmurs, gallops, rubs, or thrills RES:  Clear to auscultation bilaterally ABD:  Soft, nontender, nondistended, positive bowel sounds x 4 VASC:  +2 radial pulses, +2 carotid pulses, palpable pedal pulses NEURO:  CN 2-12 grossly intact; motor and sensory grossly intact PSYCH:  No active depression or anxiety EXT:  No edema, ecchymosis, or cyanosis  Signed, Early Osmond, MD  10/07/2021 1:59 PM    [address], [place], [place]  [address] Phone: [phone]; Fax: [phone]   Note:  This document was  prepared using Dragon voice recognition software and may include unintentional dictation errors.

## 2021-10-07 NOTE — Patient Instructions (Signed)
Medication Instructions:  No changes *If you need a refill on your cardiac medications before your next appointment, please call your pharmacy*   Lab Work: none If you have labs (blood work) drawn today and your tests are completely normal, you will receive your results only by: [place] (if you have MyChart) OR A paper copy in the mail If you have any lab test that is abnormal or we need to change your treatment, we will call you to review the results.   Testing/Procedures: Your physician has requested that you have an echocardiogram. Echocardiography is a painless test that uses sound waves to create images of your heart. It provides your doctor with information about the size and shape of your heart and how well your heart's chambers and valves are working. This procedure takes approximately one hour. There are no restrictions for this procedure.  Cardiac CTA - see instructions below  Zio Heart Monitor - [place] Instructions  Your physician has requested you wear a ZIO patch monitor for 7 days.  This is a single patch monitor. [employer] supplies one patch monitor per enrollment. Additional stickers are not available. Please do not apply patch if you will be having a Nuclear Stress Test,  Echocardiogram, Cardiac CT, MRI, or Chest Xray during the period you would be wearing the  monitor. The patch cannot be worn during these tests. You cannot remove and re-apply the  ZIO XT patch monitor.  Your ZIO patch monitor will be mailed 3 day USPS to your address on file. It may take 3-5 days  to receive your monitor after you have been enrolled.  Once you have received your monitor, please review the enclosed instructions. Your monitor  has already been registered assigning a specific monitor serial # to you.  Billing and Patient Assistance Program Information  We have supplied [employer] with any of your insurance information on file for billing purposes. [employer]  offers a sliding scale Patient Assistance Program for patients that do not have  insurance, or whose insurance does not completely cover the cost of the ZIO monitor.  You must apply for the Patient Assistance Program to qualify for this discounted rate.  To apply, please call [employer] at [phone], select option 4, select option 2, ask to apply for  Patient Assistance Program. [name] will ask your household income, and how many people  are in your household. They will quote your out-of-pocket cost based on that information.  [employer] will also be able to set up a 31-month interest-free payment plan if needed.  Applying the monitor   Shave hair from upper left chest.  Hold abrader disc by orange tab. Rub abrader in 40 strokes over the upper left chest as  indicated in your monitor instructions.  Clean area with 4 enclosed alcohol pads. Let dry.  Apply patch as indicated in monitor instructions. Patch will be placed under collarbone on left  side of chest with arrow pointing upward.  Rub patch adhesive wings for 2 minutes. Remove white label marked "1". Remove the white  label marked "2". Rub patch adhesive wings for 2 additional minutes.  While looking in a mirror, press and release button in center of patch. A small green light will  flash 3-4 times. This will be your only indicator that the monitor has been turned on.  Do not shower for the first 24 hours. You may shower after the first 24 hours.  Press the button if you feel a symptom.  You will hear a small click. Record Date, Time and  Symptom in the Patient Logbook.  When you are ready to remove the patch, follow instructions on the last 2 pages of Patient  Logbook. Stick patch monitor onto the last page of Patient Logbook.  Place Patient Logbook in the blue and white box. Use locking tab on box and tape box closed  securely. The blue and white box has prepaid postage on it. Please place it in the mailbox as  soon as possible. Your  physician should have your test results approximately 7 days after the  monitor has been mailed back to [facility].  Call [place] at [phone] if you have questions regarding  your ZIO XT patch monitor. Call them immediately if you see an orange light blinking on your  monitor.  If your monitor falls off in less than 4 days, contact our Monitor department at [phone].  If your monitor becomes loose or falls off after 4 days call [employer] at [phone] for  suggestions on securing your monitor    Follow-Up: As needed   Other Instructions   Your cardiac CT will be scheduled at  [facility], [place] [address] [phone]   Please arrive at the [facility] (Entrance C2) of [facility] 30 minutes prior to test start time. You can use the FREE valet parking offered at entrance C (encouraged to control the heart rate for the test)  Proceed to the [facility] Radiology Department (first floor) to check-in and test prep.  All radiology patients and guests should use entrance C2 at [facility], accessed from [facility], even though the hospital's physical address listed is [address].      Please follow these instructions carefully (unless otherwise directed):   On the Night Before the Test: Be sure to Drink plenty of water. Do not consume any caffeinated/decaffeinated beverages or chocolate 12 hours prior to your test. Do not take any antihistamines 12 hours prior to your test.  On the Day of the Test: Drink plenty of water until 1 hour prior to the test. Do not eat any food 4 hours prior to the test. You may take your regular medications prior to the test.  Take metoprolol (Lopressor) two hours prior to test. FEMALES- please wear underwire-free bra if available, avoid dresses & tight clothing       After the Test: Drink plenty of  water. After receiving IV contrast, you may experience a mild flushed feeling. This is normal. On occasion, you may experience a mild rash up to 24 hours after the test. This is not dangerous. If this occurs, you can take Benadryl 25 mg and increase your fluid intake. If you experience trouble breathing, this can be serious. If it is severe call 911 IMMEDIATELY. If it is mild, please call our office. If you take any of these medications: Glipizide/Metformin, Avandament, Glucavance, please do not take 48 hours after completing test unless otherwise instructed.  We will call to schedule your test 2-4 weeks out understanding that some insurance companies will need an authorization prior to the service being performed.   For non-scheduling related questions, please contact the cardiac imaging nurse navigator should you have any questions/concerns: Marchia Bond, Cardiac Imaging Nurse Navigator Gordy Clement, Cardiac Imaging Nurse Navigator [facility] Direct Office Dial: [phone]   For scheduling needs, including cancellations and rescheduling, please call  [place], [phone].

## 2021-10-07 NOTE — Progress Notes (Unsigned)
ZIO XT mailed to pt's home address. 

## 2021-10-10 DIAGNOSIS — R002 Palpitations: Secondary | ICD-10-CM

## 2021-10-21 ENCOUNTER — Ambulatory Visit: Payer: 59 | Admitting: Internal Medicine

## 2021-10-27 ENCOUNTER — Telehealth (HOSPITAL_COMMUNITY): Payer: Self-pay | Admitting: Internal Medicine

## 2021-10-27 NOTE — Telephone Encounter (Signed)
Patient called and cancelled echocardiogram and did not wish to reschedule at this time. Order will be removed from the echo WQ and if patient calls back to reschedule we will reinstate the order. Thank you

## 2021-10-28 ENCOUNTER — Telehealth (HOSPITAL_COMMUNITY): Payer: Self-pay | Admitting: *Deleted

## 2021-10-28 NOTE — Telephone Encounter (Signed)
Attempted to call patient regarding upcoming cardiac CT appointment. °Left message on voicemail with name and callback number ° °Shaqueena Mauceri RN Navigator Cardiac Imaging °[facility] °[phone] Office °[phone] Cell ° °

## 2021-10-31 ENCOUNTER — Ambulatory Visit (HOSPITAL_COMMUNITY): Admission: RE | Admit: 2021-10-31 | Payer: Self-pay | Source: Ambulatory Visit

## 2021-11-01 ENCOUNTER — Other Ambulatory Visit (HOSPITAL_COMMUNITY): Payer: 59

## 2021-11-22 ENCOUNTER — Encounter (HOSPITAL_COMMUNITY): Payer: Self-pay

## 2022-09-03 ENCOUNTER — Ambulatory Visit
Admission: RE | Admit: 2022-09-03 | Discharge: 2022-09-03 | Disposition: A | Discharge status: Home / Self Care | Payer: Self-pay | Source: Ambulatory Visit | Attending: Emergency Medicine | Admitting: Emergency Medicine

## 2022-09-03 VITALS — BP 117/78 | HR 100 | Temp 98.6°F | Resp 16

## 2022-09-03 DIAGNOSIS — R3915 Urgency of urination: Secondary | ICD-10-CM

## 2022-09-03 LAB — POCT URINALYSIS DIP (MANUAL ENTRY)
Bilirubin, UA: NEGATIVE
Glucose, UA: NEGATIVE mg/dL
Leukocytes, UA: NEGATIVE
Nitrite, UA: NEGATIVE
Spec Grav, UA: 1.02 (ref 1.010–1.025)
Urobilinogen, UA: 2 E.U./dL — AB
pH, UA: 7 (ref 5.0–8.0)

## 2022-09-03 MED ORDER — NITROFURANTOIN MONOHYD MACRO 100 MG PO CAPS: 100.0000 mg | ORAL_CAPSULE | Freq: Two times a day (BID) | ORAL | 0 refills | Status: DC

## 2022-09-03 NOTE — ED Provider Notes (Signed)
[name]    CSN: [id number] Arrival date & time: 09/03/22  0955      History   Chief Complaint Chief Complaint  Patient presents with   Appointment    1000   Dysuria         HPI SABIRA NEVELLS is a 47 y.o. female.   Patient presents for evaluation of urinary frequency urgency dysuria, lower abdominal pain and pressure and lower back pain present for 7 days.  Has attempted use of Azo which has been helpful but did not resolve symptoms.  No concern for STD, no known exposures.  Denies vaginal symptoms.  Past Medical History:  Diagnosis Date   Anemia    Anxiety    Bipolar disorder (HCC)    Chronic abdominal pain    Depression     Patient Active Problem List   Diagnosis Date Noted   S/P laparoscopic hysterectomy 09/11/2014   Bipolar disorder, current episode mixed, moderate (HCC) 09/03/2014    Past Surgical History:  Procedure Laterality Date   ABDOMINAL HYSTERECTOMY     CESAREAN SECTION  1997, 2000    x 2   KNEE SURGERY Right 1995, 1992   x 2   LAPAROSCOPIC BILATERAL SALPINGECTOMY Bilateral 09/11/2014   Procedure: LAPAROSCOPIC BILATERAL SALPINGECTOMY;  Surgeon: Shea Evans, MD;  Location: [facility];  Service: Gynecology;  Laterality: Bilateral;   LAPAROSCOPIC LYSIS OF ADHESIONS N/A 09/11/2014   Procedure: LAPAROSCOPIC LYSIS OF ADHESIONS;  Surgeon: Shea Evans, MD;  Location: [facility];  Service: Gynecology;  Laterality: N/A;   MASS EXCISION  2002   right sided abd wall mass excision   NOVASURE ABLATION  2012   ROBOTIC ASSISTED TOTAL HYSTERECTOMY N/A 09/11/2014   Procedure: ROBOTIC ASSISTED TOTAL HYSTERECTOMY;  Surgeon: Shea Evans, MD;  Location: [facility];  Service: Gynecology;  Laterality: N/A;   TUBAL LIGATION  2000    OB History     Gravida  3   Para  2   Term      Preterm      AB  1   Living  2      SAB  1   IAB  0   Ectopic  0   Multiple  0   Live Births               Home Medications    Prior to Admission  medications   Medication Sig Start Date End Date Taking? Authorizing Provider  Iron-Vitamin C (IRON 100/C PO) Take by mouth.   Yes [provider]  Multiple Vitamin (MULTIVITAMIN ADULT PO) Take by mouth.   Yes [provider]  phenazopyridine (PYRIDIUM) 95 MG tablet Take 95 mg by mouth 3 (three) times daily as needed for pain.   Yes [provider]  cholecalciferol (VITAMIN D3) 25 MCG (1000 UNIT) tablet Take 1,000 Units by mouth 2 (two) times daily.    [provider]  metoprolol tartrate (LOPRESSOR) 100 MG tablet Take 1 tablet (100 mg total) by mouth once for 1 dose. Take 90-120 minutes prior to scan. 10/07/21 10/07/21  Orbie Pyo, MD    Family History Family History  Problem Relation Age of Onset   Deep vein thrombosis Father     Social History Social History   Tobacco Use   Smoking status: Never   Smokeless tobacco: Never  Vaping Use   Vaping status: Never Used  Substance Use Topics   Alcohol use: Yes    Comment: Occa   Drug  use: No     Allergies   Amoxicillin   Review of Systems Review of Systems  Constitutional: Negative.   Respiratory: Negative.    Cardiovascular: Negative.   Genitourinary:  Positive for dysuria, frequency and urgency. Negative for decreased urine volume, difficulty urinating, dyspareunia, enuresis, flank pain, genital sores, hematuria, menstrual problem, pelvic pain, vaginal bleeding, vaginal discharge and vaginal pain.     Physical Exam Triage Vital Signs ED Triage Vitals  Encounter Vitals Group     BP 09/03/22 1010 117/78     Systolic BP Percentile --      Diastolic BP Percentile --      Pulse Rate 09/03/22 1010 100     Resp 09/03/22 1010 16     Temp 09/03/22 1010 98.6 F (37 C)     Temp Source 09/03/22 1010 Oral     SpO2 09/03/22 1010 97 %     Weight --      Height --      Head Circumference --      Peak Flow --      Pain Score 09/03/22 1013 9     Pain Loc --      Pain Education --       Exclude from Growth Chart --    No data found.  Updated Vital Signs BP 117/78 (BP Location: Left Arm)   Pulse 100   Temp 98.6 F (37 C) (Oral)   Resp 16   LMP 09/02/2014   SpO2 97%   Visual Acuity Right Eye Distance:   Left Eye Distance:   Bilateral Distance:    Right Eye Near:   Left Eye Near:    Bilateral Near:     Physical Exam Constitutional:      Appearance: Normal appearance.  Eyes:     Extraocular Movements: Extraocular movements intact.  Pulmonary:     Effort: Pulmonary effort is normal.  Abdominal:     General: Abdomen is flat. Bowel sounds are increased.     Palpations: Abdomen is soft.     Tenderness: There is generalized abdominal tenderness.  Neurological:     Mental Status: She is alert and oriented to person, place, and time. Mental status is at baseline.      UC Treatments / Results  Labs (all labs ordered are listed, but only abnormal results are displayed) Labs Reviewed  POCT URINALYSIS DIP (MANUAL ENTRY) - Abnormal; Notable for the following components:      Result Value   Ketones, POC UA trace (5) (*)    Blood, UA small (*)    Protein Ur, POC trace (*)    Urobilinogen, UA 2.0 (*)    All other components within normal limits    EKG   Radiology No results found.  Procedures Procedures (including critical care time)  Medications Ordered in UC Medications - No data to display  Initial Impression / Assessment and Plan / UC Course  I have reviewed the triage vital signs and the nursing notes.  Pertinent labs & imaging results that were available during my care of the patient were reviewed by me and considered in my medical decision making (see chart for details).  Urinary urgency  Urinalysis negative, sent for culture, vaginal swab checking for BV and yeast pending, will treat per protocol, prophylactically prescribing Macrobid and recommended additional supportive measures, may follow-up with urgent care as needed Final Clinical  Impressions(s) / UC Diagnoses   Final diagnoses:  None   Discharge Instructions  None    ED Prescriptions   None    PDMP not reviewed this encounter.   Valinda Hoar, NP 09/03/22 1116

## 2022-09-03 NOTE — Discharge Instructions (Signed)
Your urinalysis did not show shows Laurie Thornton blood cells and nitrates which are indicative of infection, your urine will be sent to the lab to determine exactly which bacteria is present, if any changes need to be made to your medications you will be notified  Begin use of Macrobid every morning and every evening for 5 days  Vaginal swab checking for bacterial vaginosis and yeast is pending, you will be notified of positive test results only medication sent in at time of notification  You may use over-the-counter Azo to help minimize your symptoms until antibiotic removes bacteria, this medication will turn your urine orange  Increase your fluid intake through use of water  As always practice good hygiene, wiping front to back and avoidance of scented vaginal products to prevent further irritation  If symptoms continue to persist after use of medication or recur please follow-up with urgent care or your primary doctor as needed

## 2022-09-03 NOTE — ED Triage Notes (Signed)
Pt reports urgency to urinate, low back pain and groin pain x 1 week. AZO gives some relief.

## 2022-09-04 LAB — CERVICOVAGINAL ANCILLARY ONLY
Bacterial Vaginitis (gardnerella): NEGATIVE
Candida Glabrata: NEGATIVE
Candida Vaginitis: NEGATIVE
Comment: NEGATIVE
Comment: NEGATIVE
Comment: NEGATIVE

## 2022-09-05 LAB — URINE CULTURE: Culture: <10000 — AB

## 2022-09-21 HISTORY — PX: COLONOSCOPY: SHX5424

## 2022-10-29 IMAGING — CT CT NECK W/ CM
3 of 4 series · 13 of 33 positions shown, 16 images · IV contrast (OMNIPAQUE 350)
Comparison: None

CLINICAL DATA: Soft tissue swelling. Infection suspected. Patient
reports small bites next to her mouth. Swelling under the jaw.

EXAM:
CT NECK WITH CONTRAST
TECHNIQUE: Multidetector CT imaging of the neck was performed using the
standard protocol following the bolus administration of intravenous
contrast.
CONTRAST:  80mL OMNIPAQUE IOHEXOL 350 MG/ML SOLN

[Series 3: axial neck · axial · 0.46mm/px · z∈[-302,-158]mm · 5 of 108 slices shown, 7 images]
[im 18/108  soft-tissue]
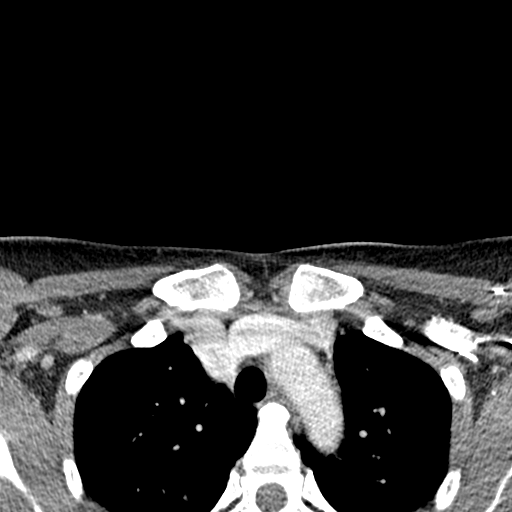
[im 18/108  bone]
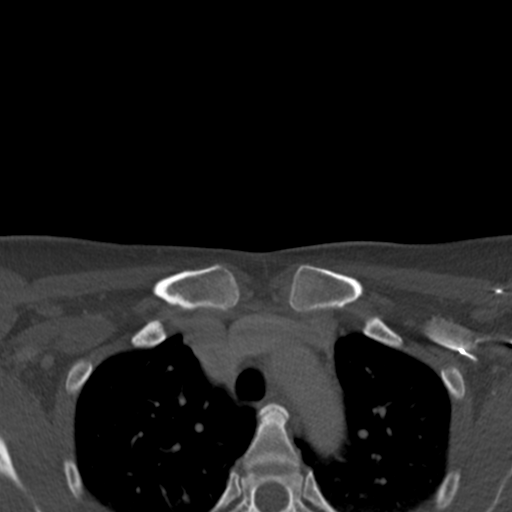
[im 36/108  bone]
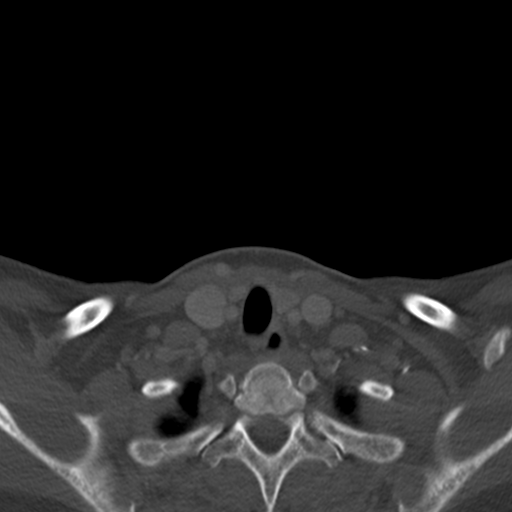
[im 54/108  bone]
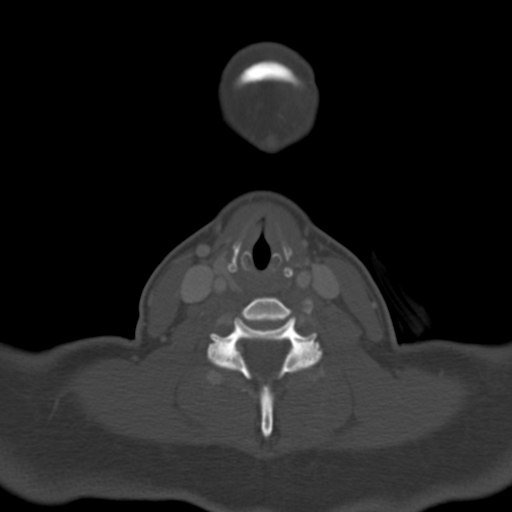
[im 72/108  bone]
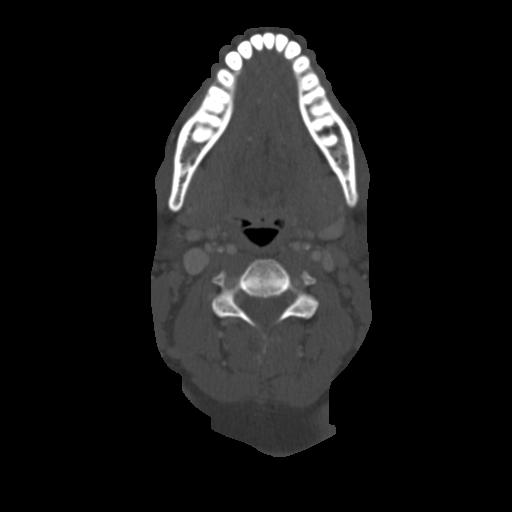
[im 90/108  soft-tissue]
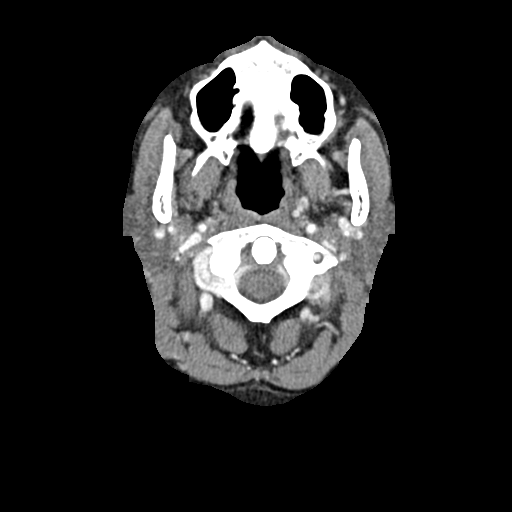
[im 90/108  bone]
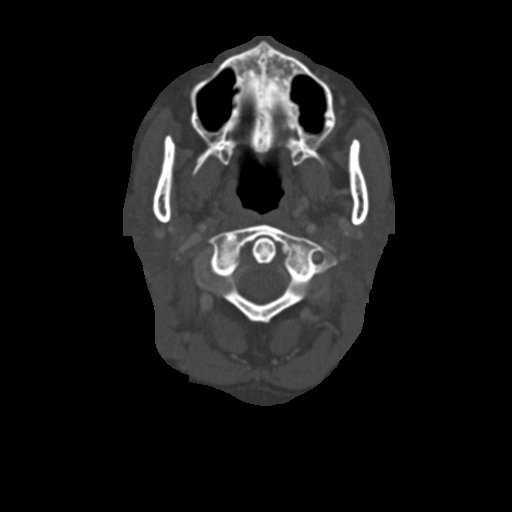

[Series 7: coronal · coronal · 0.38mm/px · 3 of 101 slices shown]
[im 31/101  bone]
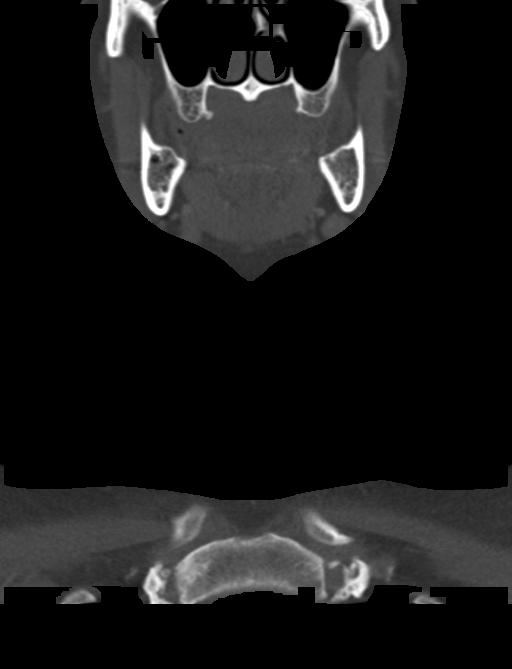
[im 44/101  bone]
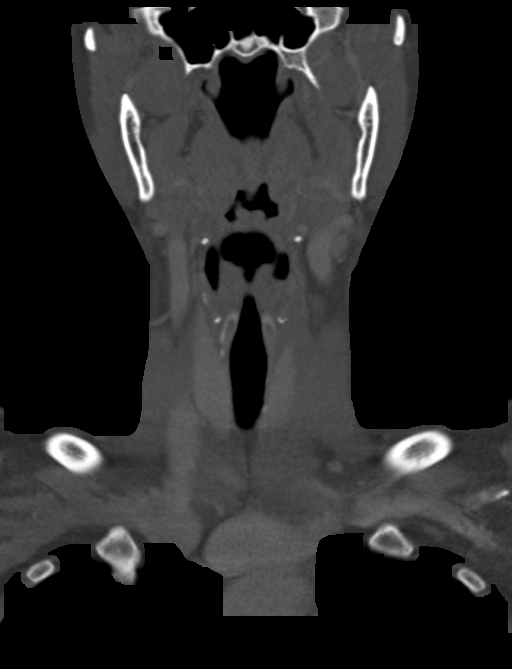
[im 57/101  bone]
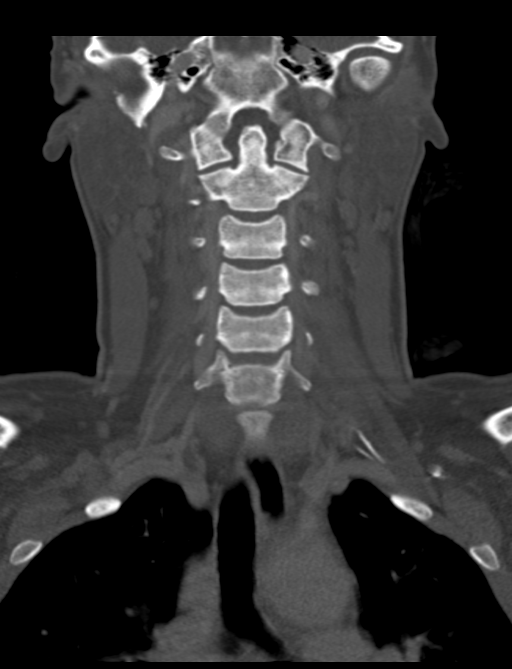

[Series 8: sagittal · sagittal · 0.43mm/px · 5 of 101 slices shown, 6 images]
[im 34/101  bone]
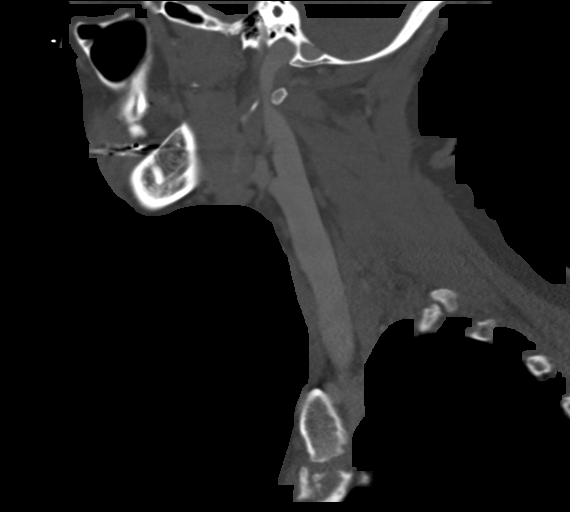
[im 42/101  bone]
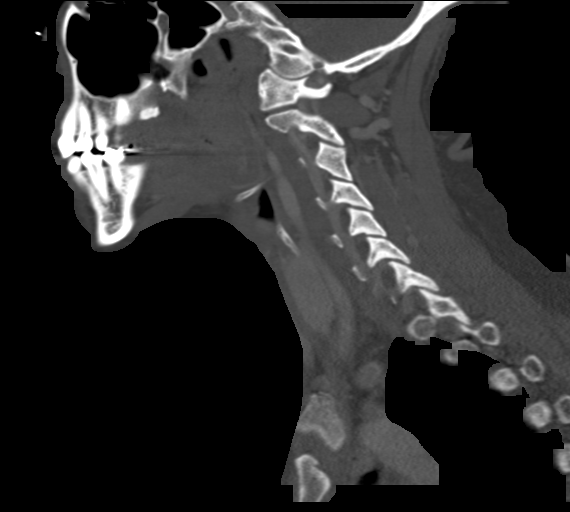
[im 51/101  soft-tissue]
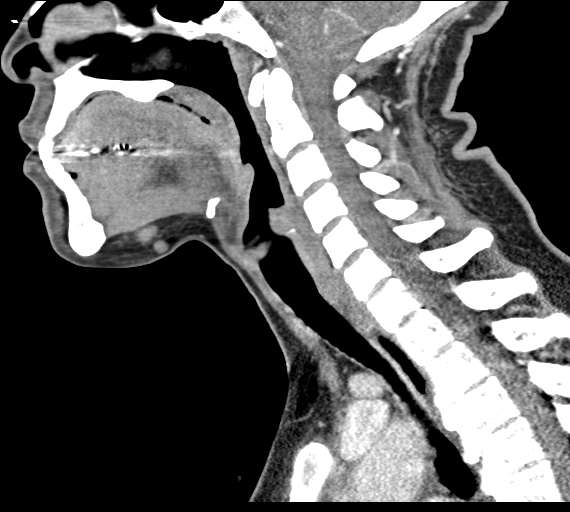
[im 51/101  bone]
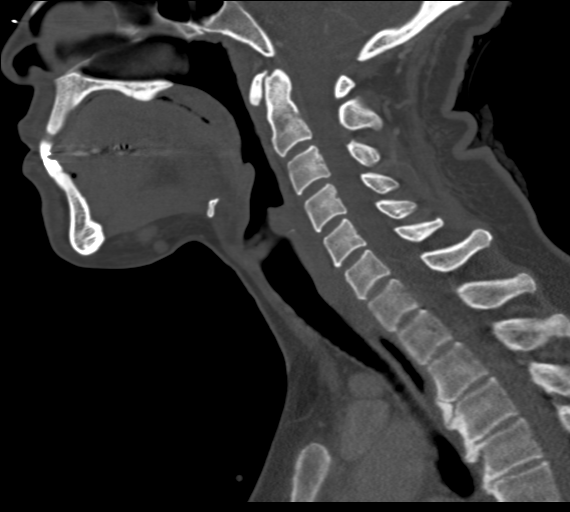
[im 59/101  bone]
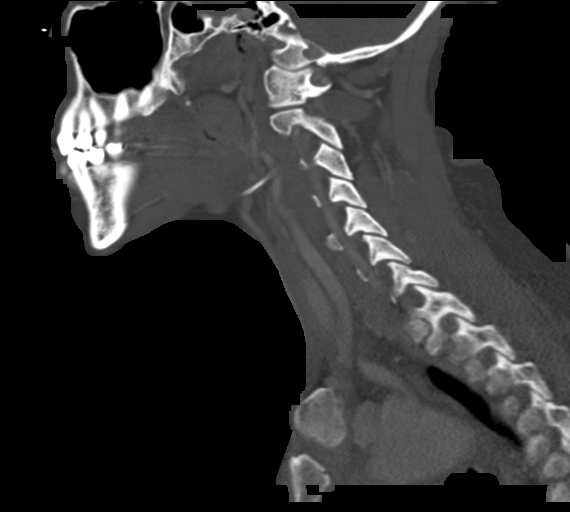
[im 67/101  bone]
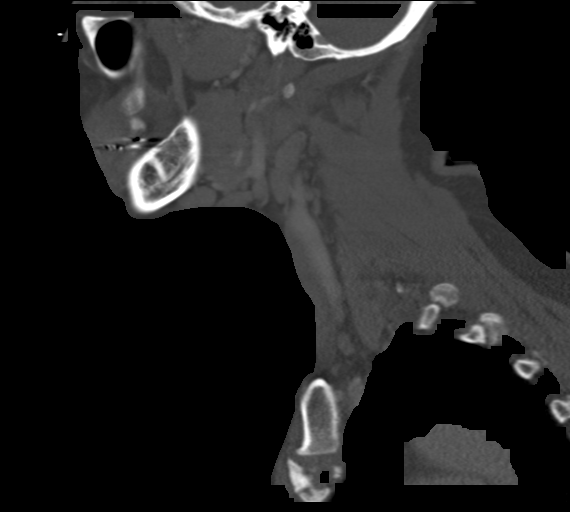

[13 of 33 positions shown; findings below may reference images not displayed]

FINDINGS: Pharynx and larynx: No focal mucosal or submucosal lesions are
present. Nasopharynx is clear. Soft palate tongue base are within
normal limits. Vallecula and epiglottis are within normal limits.
Aryepiglottic folds and piriform sinuses are clear. Vocal cords are
midline and symmetric. Trachea is clear.

Salivary glands: The submandibular and parotid glands and ducts are
within normal limits.

Thyroid: Normal.

Lymph nodes:

Submental lymph node measures 1.6 x 1.1 x 1.1 cm. This is adjacent
to the area marked. The smaller rounded node is present just
inferior to the larger node, measuring up to 6 mm. The 1 on

Left greater than right submandibular lymph nodes are noted. None
are enlarged. Multiple small level 2 lymph nodes are also present
bilaterally. The right jugulodigastric lymph node measures 17 mm.
The largest left-sided node measures 22 mm. No necrotic nodes are
present. No other enlarged nodes are present in the neck.

Vascular: Unremarkable

Limited intracranial: Within normal limits.

Visualized orbits: The globes and orbits are within normal limits.

Mastoids and visualized paranasal sinuses: The paranasal sinuses and
mastoid air cells are clear.

Skeleton: Vertebral body heights and alignment are normal. No focal
osseous lesions are present.

Upper chest: The lung apices are clear. Thoracic inlet is within
normal limits.

Other:
IMPRESSION: 1. 1.6 x 1.1 x 1.1 cm submental lymph node is adjacent to the area
marked. This may be related to a superficial infection.
2. Additional rounded node just inferior to the larger node measures
up to 6 mm.
3. Multiple small level 2 lymph nodes bilaterally are also present.
None are enlarged. No necrotic nodes are present. These appear
reactive.
4. No other enlarged nodes are present in the neck.
5. No primary lesion or abscess.

## 2022-11-17 ENCOUNTER — Ambulatory Visit
Admission: EM | Admit: 2022-11-17 | Discharge: 2022-11-17 | Disposition: A | Discharge status: Home / Self Care | Payer: Self-pay | Attending: Physician Assistant | Admitting: Physician Assistant

## 2022-11-17 DIAGNOSIS — R300 Dysuria: Secondary | ICD-10-CM

## 2022-11-17 MED ORDER — NITROFURANTOIN MONOHYD MACRO 100 MG PO CAPS
100.0000 mg | ORAL_CAPSULE | Freq: Two times a day (BID) | ORAL | 0 refills | Status: AC
Start: 2022-11-17 — End: ?

## 2022-11-17 NOTE — ED Provider Notes (Signed)
[name]    CSN: [id number] Arrival date & time: 11/17/22  0865      History   Chief Complaint No chief complaint on file.   HPI Laurie Thornton is a 47 y.o. female.   HPI  Past Medical History:  Diagnosis Date   Anemia    Anxiety    Bipolar disorder (HCC)    Chronic abdominal pain    Depression     Patient Active Problem List   Diagnosis Date Noted   S/P laparoscopic hysterectomy 09/11/2014   Bipolar disorder, current episode mixed, moderate (HCC) 09/03/2014    Past Surgical History:  Procedure Laterality Date   ABDOMINAL HYSTERECTOMY     CESAREAN SECTION  1997, 2000    x 2   KNEE SURGERY Right 1995, 1992   x 2   LAPAROSCOPIC BILATERAL SALPINGECTOMY Bilateral 09/11/2014   Procedure: LAPAROSCOPIC BILATERAL SALPINGECTOMY;  Surgeon: Shea Evans, MD;  Location: [facility];  Service: Gynecology;  Laterality: Bilateral;   LAPAROSCOPIC LYSIS OF ADHESIONS N/A 09/11/2014   Procedure: LAPAROSCOPIC LYSIS OF ADHESIONS;  Surgeon: Shea Evans, MD;  Location: [facility];  Service: Gynecology;  Laterality: N/A;   MASS EXCISION  2002   right sided abd wall mass excision   NOVASURE ABLATION  2012   ROBOTIC ASSISTED TOTAL HYSTERECTOMY N/A 09/11/2014   Procedure: ROBOTIC ASSISTED TOTAL HYSTERECTOMY;  Surgeon: Shea Evans, MD;  Location: [facility];  Service: Gynecology;  Laterality: N/A;   TUBAL LIGATION  2000    OB History     Gravida  3   Para  2   Term      Preterm      AB  1   Living  2      SAB  1   IAB  0   Ectopic  0   Multiple  0   Live Births               Home Medications    Prior to Admission medications   Medication Sig Start Date End Date Taking? Authorizing Provider  cholecalciferol (VITAMIN D3) 25 MCG (1000 UNIT) tablet Take 1,000 Units by mouth 2 (two) times daily.    [provider]  Iron-Vitamin C (IRON 100/C PO) Take by mouth.    [provider]  metoprolol tartrate (LOPRESSOR) 100 MG tablet Take 1  tablet (100 mg total) by mouth once for 1 dose. Take 90-120 minutes prior to scan. 10/07/21 10/07/21  Orbie Pyo, MD  Multiple Vitamin (MULTIVITAMIN ADULT PO) Take by mouth.    [provider]  nitrofurantoin, macrocrystal-monohydrate, (MACROBID) 100 MG capsule Take 1 capsule (100 mg total) by mouth 2 (two) times daily. 09/03/22   Valinda Hoar, NP  phenazopyridine (PYRIDIUM) 95 MG tablet Take 95 mg by mouth 3 (three) times daily as needed for pain.    [provider]    Family History Family History  Problem Relation Age of Onset   Deep vein thrombosis Father     Social History Social History   Tobacco Use   Smoking status: Never   Smokeless tobacco: Never  Vaping Use   Vaping status: Never Used  Substance Use Topics   Alcohol use: Yes    Comment: Occa   Drug use: No     Allergies   Amoxicillin   Review of Systems Review of Systems  Constitutional:  Negative for chills and fever.  Eyes:  Negative for discharge and redness.  Gastrointestinal:  Negative for  abdominal pain, nausea and vomiting.  Genitourinary:  Positive for dysuria.     Physical Exam Triage Vital Signs ED Triage Vitals  Encounter Vitals Group     BP      Systolic BP Percentile      Diastolic BP Percentile      Pulse      Resp      Temp      Temp src      SpO2      Weight      Height      Head Circumference      Peak Flow      Pain Score      Pain Loc      Pain Education      Exclude from Growth Chart    No data found.  Updated Vital Signs LMP 09/02/2014   Physical Exam Vitals and nursing note reviewed.  Constitutional:      General: She is not in acute distress.    Appearance: Normal appearance. She is not ill-appearing.  HENT:     Head: Normocephalic and atraumatic.  Eyes:     Conjunctiva/sclera: Conjunctivae normal.  Cardiovascular:     Rate and Rhythm: Normal rate.  Pulmonary:     Effort: Pulmonary effort is normal.  Neurological:     Mental  Status: She is alert.  Psychiatric:        Mood and Affect: Mood normal.        Behavior: Behavior normal.        Thought Content: Thought content normal.      UC Treatments / Results  Labs (all labs ordered are listed, but only abnormal results are displayed) Labs Reviewed - No data to display  EKG   Radiology No results found.  Procedures Procedures (including critical care time)  Medications Ordered in UC Medications - No data to display  Initial Impression / Assessment and Plan / UC Course  I have reviewed the triage vital signs and the nursing notes.  Pertinent labs & imaging results that were available during my care of the patient were reviewed by me and considered in my medical decision making (see chart for details).     *** Final Clinical Impressions(s) / UC Diagnoses   Final diagnoses:  None   Discharge Instructions   None    ED Prescriptions   None    PDMP not reviewed this encounter.

## 2022-11-17 NOTE — ED Triage Notes (Signed)
"  I think I have a UTI, When I go to the bathroom it smells , I am having lower right/left back pain as well. No fever". No vaginal discharge or concern for STI.  "Also having ha's, stomach pain as well, I am on semaglutide as well. I also have periodic neck pain, but I did just have a long 90 min massage, in case that is related".

## 2022-11-19 LAB — URINE CULTURE: Culture: >=100000 — AB

## 2022-11-20 ENCOUNTER — Telehealth: Payer: Self-pay | Admitting: Emergency Medicine

## 2022-11-20 MED ORDER — SULFAMETHOXAZOLE-TRIMETHOPRIM 800-160 MG PO TABS: 1.0000 | ORAL_TABLET | Freq: Two times a day (BID) | ORAL | 0 refills | Status: AC

## 2022-11-20 NOTE — Telephone Encounter (Signed)
Change in abx for positive urine culture

## 2022-11-21 ENCOUNTER — Ambulatory Visit
Admission: EM | Admit: 2022-11-21 | Discharge: 2022-11-21 | Disposition: A | Discharge status: Home / Self Care | Payer: Self-pay

## 2022-11-21 ENCOUNTER — Other Ambulatory Visit: Payer: Self-pay

## 2022-11-21 ENCOUNTER — Encounter: Payer: Self-pay | Admitting: Physician Assistant

## 2022-11-21 DIAGNOSIS — R21 Rash and other nonspecific skin eruption: Secondary | ICD-10-CM

## 2022-11-21 DIAGNOSIS — T63301A Toxic effect of unspecified spider venom, accidental (unintentional), initial encounter: Secondary | ICD-10-CM

## 2022-11-21 NOTE — ED Provider Notes (Signed)
[name]    CSN: [id number] Arrival date & time: 11/21/22  1624      History   Chief Complaint Chief Complaint  Patient presents with   Insect Bite    Insect bite left leg x1 day    HPI Laurie Thornton is a 47 y.o. female.   Patient presents with concern of spider bite to left lateral lower leg that occurred today while at work.  Patient reports that she she did not see or witness spider bite her but started feeling some itching, then she noticed area of redness to left lateral lower leg.  Denies fever, body aches, chills, nausea, vomiting.  She has not taken any medication for symptoms.     Past Medical History:  Diagnosis Date   Anemia    Anxiety    Bipolar disorder (HCC)    Chronic abdominal pain    Depression     Patient Active Problem List   Diagnosis Date Noted   Knee mass, left 09/29/2020   History of 2019 novel coronavirus disease (COVID-19) 10/18/2018   S/P laparoscopic hysterectomy 09/11/2014   Bipolar disorder, current episode mixed, moderate (HCC) 09/03/2014    Past Surgical History:  Procedure Laterality Date   ABDOMINAL HYSTERECTOMY     CESAREAN SECTION  1997, 2000    x 2   COLONOSCOPY  09/2022   KNEE SURGERY Right 1995, 1992   x 2   LAPAROSCOPIC BILATERAL SALPINGECTOMY Bilateral 09/11/2014   Procedure: LAPAROSCOPIC BILATERAL SALPINGECTOMY;  Surgeon: Shea Evans, MD;  Location: [facility];  Service: Gynecology;  Laterality: Bilateral;   LAPAROSCOPIC LYSIS OF ADHESIONS N/A 09/11/2014   Procedure: LAPAROSCOPIC LYSIS OF ADHESIONS;  Surgeon: Shea Evans, MD;  Location: [facility];  Service: Gynecology;  Laterality: N/A;   MASS EXCISION  02/21/2000   right sided abd wall mass excision   NOVASURE ABLATION  02/20/2010   ROBOTIC ASSISTED TOTAL HYSTERECTOMY N/A 09/11/2014   Procedure: ROBOTIC ASSISTED TOTAL HYSTERECTOMY;  Surgeon: Shea Evans, MD;  Location: [facility];  Service: Gynecology;  Laterality: N/A;   TUBAL LIGATION  02/20/1998     OB History     Gravida  3   Para  2   Term      Preterm      AB  1   Living  2      SAB  1   IAB  0   Ectopic  0   Multiple  0   Live Births               Home Medications    Prior to Admission medications   Medication Sig Start Date End Date Taking? Authorizing Provider  cholecalciferol (VITAMIN D3) 25 MCG (1000 UNIT) tablet Take 1,000 Units by mouth 2 (two) times daily.   Yes [provider]  Cyanocobalamin (VITAMIN B 12 PO) Take by mouth.   Yes [provider]  GAVILYTE-C 240 g solution Take 4,000 mLs by mouth once. 09/23/22  Yes [provider]  Iron-Vitamin C (IRON 100/C PO) Take by mouth.   Yes [provider]  Multiple Vitamin (MULTIVITAMIN ADULT PO) Take by mouth.   Yes [provider]  nitrofurantoin, macrocrystal-monohydrate, (MACROBID) 100 MG capsule Take 1 capsule (100 mg total) by mouth 2 (two) times daily. 11/17/22  Yes Tomi Bamberger, PA-C  phenazopyridine (PYRIDIUM) 95 MG tablet Take 95 mg by mouth 3 (three) times daily as needed for pain.   Yes [provider]  [facility]  [place] Inject into the skin.   Yes [provider]  sulfamethoxazole-trimethoprim (BACTRIM DS) 800-160 MG tablet Take 1 tablet by mouth 2 (two) times daily for 3 days. 11/20/22 11/23/22 Yes Lamptey, Britta Mccreedy, MD  metoprolol tartrate (LOPRESSOR) 100 MG tablet Take 1 tablet (100 mg total) by mouth once for 1 dose. Take 90-120 minutes prior to scan. 10/07/21 10/07/21  Orbie Pyo, MD    Family History Family History  Problem Relation Age of Onset   Deep vein thrombosis Father     Social History Social History   Tobacco Use   Smoking status: Never   Smokeless tobacco: Never  Vaping Use   Vaping status: Never Used  Substance Use Topics   Alcohol use: Yes    Alcohol/week: 1.0 standard drink of alcohol    Types: 1 Glasses of wine per week    Comment: Occassionally.   Drug use: No      Allergies   Amoxicillin and Penicillin g   Review of Systems Review of Systems Per HPI  Physical Exam Triage Vital Signs ED Triage Vitals  Encounter Vitals Group     BP 11/21/22 1731 114/77     Systolic BP Percentile --      Diastolic BP Percentile --      Pulse Rate 11/21/22 1731 78     Resp 11/21/22 1731 17     Temp 11/21/22 1731 98.4 F (36.9 C)     Temp Source 11/21/22 1731 Oral     SpO2 11/21/22 1731 99 %     Weight 11/21/22 1729 148 lb (67.1 kg)     Height 11/21/22 1729 5\' 6"  (1.676 m)     Head Circumference --      Peak Flow --      Pain Score 11/21/22 1729 0     Pain Loc --      Pain Education --      Exclude from Growth Chart --    No data found.  Updated Vital Signs BP 114/77 (BP Location: Left Arm)   Pulse 78   Temp 98.4 F (36.9 C) (Oral)   Resp 17   Ht 5\' 6"  (1.676 m)   Wt 148 lb (67.1 kg)   LMP 09/02/2014   SpO2 99%   BMI 23.89 kg/m   Visual Acuity Right Eye Distance:   Left Eye Distance:   Bilateral Distance:    Right Eye Near:   Left Eye Near:    Bilateral Near:     Physical Exam Constitutional:      General: She is not in acute distress.    Appearance: Normal appearance. She is not toxic-appearing or diaphoretic.  HENT:     Head: Normocephalic and atraumatic.  Eyes:     Extraocular Movements: Extraocular movements intact.     Conjunctiva/sclera: Conjunctivae normal.  Pulmonary:     Effort: Pulmonary effort is normal.  Skin:    Comments: Patient has approximately 2.5 cm in diameter circular, flat erythematous rash present to mid lateral left lower leg.  No area of induration or fluctuance.  No lesions noted.  Neurological:     General: No focal deficit present.     Mental Status: She is alert and oriented to person, place, and time. Mental status is at baseline.  Psychiatric:        Mood and Affect: Mood normal.        Behavior: Behavior normal.        Thought Content: Thought content normal.  Judgment: Judgment  normal.      UC Treatments / Results  Labs (all labs ordered are listed, but only abnormal results are displayed) Labs Reviewed - No data to display  EKG   Radiology No results found.  Procedures Procedures (including critical care time)  Medications Ordered in UC Medications - No data to display  Initial Impression / Assessment and Plan / UC Course  I have reviewed the triage vital signs and the nursing notes.  Pertinent labs & imaging results that were available during my care of the patient were reviewed by me and considered in my medical decision making (see chart for details).     Rash does appear to be consistent with a spider bite.  No concern for cellulitis or infection given that it just occurred today.  Patient also reports that she is currently taking Bactrim for UTI so this should cover for infection as well.  Advised patient to monitor closely for any worsening symptoms including increased redness, swelling, pus, black discoloration and follow-up if this occurs.  Patient verbalized understanding and was agreeable with plan. Final Clinical Impressions(s) / UC Diagnoses   Final diagnoses:  Spider bite wound, accidental or unintentional, initial encounter  Rash and nonspecific skin eruption     Discharge Instructions      Your rash does appear to be consistent with a spider bite.  As we discussed, monitor closely for any increased redness, swelling, pus, black discoloration and follow-up if this occurs.    ED Prescriptions   None    PDMP not reviewed this encounter.   Gustavus Bryant, [place] 11/21/22 [phone]

## 2022-11-21 NOTE — ED Triage Notes (Signed)
Pt states that she has a spider bite to her left leg. Pt states that she has a red spot on her left leg. X1 day

## 2022-11-21 NOTE — Discharge Instructions (Signed)
Your rash does appear to be consistent with a spider bite.  As we discussed, monitor closely for any increased redness, swelling, pus, black discoloration and follow-up if this occurs.
